# Patient Record
Sex: Female | Born: 1950 | Race: White | Hispanic: No | Smoking: Never smoker
Health system: Southern US, Community
[De-identification: ages and names within clinical notes are randomized; demographics above are authoritative.]

## PROBLEM LIST (undated history)

## (undated) DIAGNOSIS — F419 Anxiety disorder, unspecified: Secondary | ICD-10-CM

## (undated) DIAGNOSIS — M79609 Pain in unspecified limb: Secondary | ICD-10-CM

## (undated) DIAGNOSIS — N6320 Unspecified lump in the left breast, unspecified quadrant: Secondary | ICD-10-CM

## (undated) DIAGNOSIS — R200 Anesthesia of skin: Secondary | ICD-10-CM

## (undated) DIAGNOSIS — N959 Unspecified menopausal and perimenopausal disorder: Secondary | ICD-10-CM

## (undated) DIAGNOSIS — I10 Essential (primary) hypertension: Secondary | ICD-10-CM

## (undated) DIAGNOSIS — J019 Acute sinusitis, unspecified: Secondary | ICD-10-CM

## (undated) DIAGNOSIS — E785 Hyperlipidemia, unspecified: Secondary | ICD-10-CM

## (undated) HISTORY — DX: Pain in unspecified limb: M79.609

## (undated) HISTORY — DX: Anesthesia of skin: R20.0

## (undated) HISTORY — DX: Acute sinusitis, unspecified: J01.90

## (undated) HISTORY — DX: Hyperlipidemia, unspecified: E78.5

## (undated) HISTORY — DX: Essential (primary) hypertension: I10

## (undated) HISTORY — PX: COLONOSCOPY: SHX174

## (undated) HISTORY — DX: Unspecified menopausal and perimenopausal disorder: N95.9

---

## 1998-12-02 ENCOUNTER — Other Ambulatory Visit: Admission: RE | Admit: 1998-12-02 | Discharge: 1998-12-02 | Payer: Self-pay | Admitting: Gynecology

## 1999-08-14 ENCOUNTER — Emergency Department (HOSPITAL_COMMUNITY): Admission: EM | Admit: 1999-08-14 | Discharge: 1999-08-14 | Payer: Self-pay | Admitting: Family Medicine

## 1999-08-14 ENCOUNTER — Encounter: Payer: Self-pay | Admitting: Family Medicine

## 2000-01-04 ENCOUNTER — Other Ambulatory Visit: Admission: RE | Admit: 2000-01-04 | Discharge: 2000-01-04 | Payer: Self-pay | Admitting: Gynecology

## 2001-02-02 ENCOUNTER — Other Ambulatory Visit: Admission: RE | Admit: 2001-02-02 | Discharge: 2001-02-02 | Payer: Self-pay | Admitting: Gynecology

## 2002-02-05 ENCOUNTER — Other Ambulatory Visit: Admission: RE | Admit: 2002-02-05 | Discharge: 2002-02-05 | Payer: Self-pay | Admitting: Gynecology

## 2003-02-07 ENCOUNTER — Other Ambulatory Visit: Admission: RE | Admit: 2003-02-07 | Discharge: 2003-02-07 | Payer: Self-pay | Admitting: Gynecology

## 2003-10-29 ENCOUNTER — Encounter: Admission: RE | Admit: 2003-10-29 | Discharge: 2003-10-29 | Payer: Self-pay | Admitting: Family Medicine

## 2004-03-10 ENCOUNTER — Other Ambulatory Visit: Admission: RE | Admit: 2004-03-10 | Discharge: 2004-03-10 | Payer: Self-pay | Admitting: Gynecology

## 2004-04-10 ENCOUNTER — Encounter: Admission: RE | Admit: 2004-04-10 | Discharge: 2004-04-10 | Payer: Self-pay | Admitting: Family Medicine

## 2005-03-11 ENCOUNTER — Other Ambulatory Visit: Admission: RE | Admit: 2005-03-11 | Discharge: 2005-03-11 | Payer: Self-pay | Admitting: Gynecology

## 2005-05-12 ENCOUNTER — Encounter (INDEPENDENT_AMBULATORY_CARE_PROVIDER_SITE_OTHER): Payer: Self-pay | Admitting: Specialist

## 2005-05-12 ENCOUNTER — Ambulatory Visit (HOSPITAL_BASED_OUTPATIENT_CLINIC_OR_DEPARTMENT_OTHER): Admission: RE | Admit: 2005-05-12 | Discharge: 2005-05-12 | Payer: Self-pay | Admitting: Plastic Surgery

## 2007-06-28 ENCOUNTER — Ambulatory Visit: Payer: Self-pay | Admitting: Gastroenterology

## 2008-01-14 ENCOUNTER — Encounter: Admission: RE | Admit: 2008-01-14 | Discharge: 2008-01-14 | Payer: Self-pay | Admitting: Family Medicine

## 2008-07-31 ENCOUNTER — Ambulatory Visit: Payer: Self-pay | Admitting: Gastroenterology

## 2008-08-14 ENCOUNTER — Ambulatory Visit: Payer: Self-pay | Admitting: Gastroenterology

## 2009-04-24 ENCOUNTER — Ambulatory Visit: Payer: Self-pay | Admitting: Internal Medicine

## 2009-04-24 DIAGNOSIS — E785 Hyperlipidemia, unspecified: Secondary | ICD-10-CM | POA: Insufficient documentation

## 2009-04-24 DIAGNOSIS — N959 Unspecified menopausal and perimenopausal disorder: Secondary | ICD-10-CM | POA: Insufficient documentation

## 2009-04-24 DIAGNOSIS — M79609 Pain in unspecified limb: Secondary | ICD-10-CM | POA: Insufficient documentation

## 2009-04-25 ENCOUNTER — Encounter: Payer: Self-pay | Admitting: Internal Medicine

## 2009-05-01 ENCOUNTER — Ambulatory Visit: Payer: Self-pay | Admitting: Internal Medicine

## 2009-05-03 LAB — CONVERTED CEMR LAB: Vit D, 25-Hydroxy: 51 ng/mL (ref 30–89)

## 2009-05-07 ENCOUNTER — Encounter (INDEPENDENT_AMBULATORY_CARE_PROVIDER_SITE_OTHER): Payer: Self-pay | Admitting: *Deleted

## 2009-05-07 ENCOUNTER — Telehealth (INDEPENDENT_AMBULATORY_CARE_PROVIDER_SITE_OTHER): Payer: Self-pay | Admitting: *Deleted

## 2009-05-11 LAB — CONVERTED CEMR LAB
ALT: 20 units/L (ref 0–35)
AST: 23 units/L (ref 0–37)
Albumin: 3.9 g/dL (ref 3.5–5.2)
Alkaline Phosphatase: 38 units/L — ABNORMAL LOW (ref 39–117)
BUN: 10 mg/dL (ref 6–23)
Basophils Absolute: 0.1 10*3/uL (ref 0.0–0.1)
Basophils Relative: 0.6 % (ref 0.0–3.0)
Bilirubin, Direct: 0.2 mg/dL (ref 0.0–0.3)
CO2: 30 meq/L (ref 19–32)
Calcium: 9.2 mg/dL (ref 8.4–10.5)
Chloride: 103 meq/L (ref 96–112)
Creatinine, Ser: 0.7 mg/dL (ref 0.4–1.2)
Eosinophils Absolute: 0.2 10*3/uL (ref 0.0–0.7)
Eosinophils Relative: 2.3 % (ref 0.0–5.0)
GFR calc non Af Amer: 91.36 mL/min (ref >60)
Glucose, Bld: 79 mg/dL (ref 70–99)
HCT: 39.8 % (ref 36.0–46.0)
Hemoglobin: 13.5 g/dL (ref 12.0–15.0)
Lymphocytes Relative: 35 % (ref 12.0–46.0)
Lymphs Abs: 3.1 10*3/uL (ref 0.7–4.0)
MCHC: 33.8 g/dL (ref 30.0–36.0)
MCV: 92.8 fL (ref 78.0–100.0)
Monocytes Absolute: 0.6 10*3/uL (ref 0.1–1.0)
Monocytes Relative: 6.9 % (ref 3.0–12.0)
Neutro Abs: 4.8 10*3/uL (ref 1.4–7.7)
Neutrophils Relative %: 55.2 % (ref 43.0–77.0)
Platelets: 241 10*3/uL (ref 150.0–400.0)
Potassium: 4.2 meq/L (ref 3.5–5.1)
RBC: 4.29 M/uL (ref 3.87–5.11)
RDW: 12.4 % (ref 11.5–14.6)
Sodium: 138 meq/L (ref 135–145)
TSH: 2.15 microintl units/mL (ref 0.35–5.50)
Total Bilirubin: 0.9 mg/dL (ref 0.3–1.2)
Total Protein: 6.9 g/dL (ref 6.0–8.3)
Uric Acid, Serum: 4.1 mg/dL (ref 2.4–7.0)
WBC: 8.8 10*3/uL (ref 4.5–10.5)

## 2009-09-30 ENCOUNTER — Ambulatory Visit: Payer: Self-pay | Admitting: Family

## 2009-09-30 DIAGNOSIS — J019 Acute sinusitis, unspecified: Secondary | ICD-10-CM | POA: Insufficient documentation

## 2009-10-13 ENCOUNTER — Telehealth: Payer: Self-pay | Admitting: Internal Medicine

## 2010-02-06 ENCOUNTER — Emergency Department (HOSPITAL_COMMUNITY): Admission: EM | Admit: 2010-02-06 | Discharge: 2010-02-06 | Payer: Self-pay | Admitting: Emergency Medicine

## 2011-03-12 NOTE — Op Note (Signed)
Heather Summers, Heather Summers              ACCOUNT NO.:  [id number]   MEDICAL RECORD NO.:  [id number]          PATIENT TYPE:  AMB   LOCATION:  DSC                          FACILITY:  [facility]   PHYSICIAN:  Alfredia Ferguson, M.D.  DATE OF BIRTH:  1951-02-02   DATE OF PROCEDURE:  05/12/2005  DATE OF DISCHARGE:                                 OPERATIVE REPORT   PREOPERATIVE DIAGNOSIS:  Biopsy proven 6 mm dysplastic nevus right posterior  shoulder.   POSTOPERATIVE DIAGNOSIS:  Biopsy proven 6 mm dysplastic nevus right  posterior shoulder.   OPERATION PERFORMED:  Re-excision of biopsy site, dysplastic nevus right  posterior shoulder, with 2 mm margins.   SURGEON:  Alfredia Ferguson, M.D.   ANESTHESIA:  2% Xylocaine with 1:100,000 epinephrine.   INDICATIONS FOR PROCEDURE:  60 year old woman with a biopsy proven  dysplastic nevus right posterior shoulder.  The margins were positive.  She  wishes that I carry out wide excision of the biopsy site to clear the  margins.  She understands the risks of unsightly scarring at the location of  the excision.  In spite of these and other risks discussed with the patient,  the patient wishes to proceed with the surgery.   DESCRIPTION OF PROCEDURE:  Skin marks were placed around the biopsy site  with 2 mm margins.  Local anesthesia was infiltrated with 2% Xylocaine with  1:100,000 epinephrine.  The area was prepped with Betadine and draped with  sterile drapes.  An elliptical excision of the lesion was carried out with 2  mm margins.  The specimen was submitted for pathology.  Wound edges were  undermined for a distance of several mm in all directions.  The wound was  closed by reapproximating the dermis with interrupted 4-0 Monocryl sutures.  The skin edges were united with a running 4-0 Monocryl subcuticular.  The  area was cleansed and dried and Steri-Strips were applied.  The patient  tolerated the procedure well with minimal blood loss.  A light dressing  was  applied and the patient was discharged home in satisfactory condition.      Alfredia Ferguson, M.D.  Electronically Signed     WBB/MEDQ  D:  05/12/2005  T:  05/12/2005  Job:  [id number]   cc:   Venancio Poisson, M.D.

## 2011-07-19 ENCOUNTER — Other Ambulatory Visit: Payer: Self-pay | Admitting: Gynecology

## 2011-08-26 ENCOUNTER — Other Ambulatory Visit: Payer: Self-pay | Admitting: Dermatology

## 2012-02-25 ENCOUNTER — Other Ambulatory Visit: Payer: Self-pay | Admitting: Dermatology

## 2012-03-29 ENCOUNTER — Other Ambulatory Visit: Payer: Self-pay | Admitting: Dermatology

## 2012-10-30 ENCOUNTER — Other Ambulatory Visit: Payer: Self-pay | Admitting: Gynecology

## 2013-09-01 ENCOUNTER — Encounter: Payer: Self-pay | Admitting: *Deleted

## 2013-09-01 ENCOUNTER — Encounter: Payer: Self-pay | Admitting: Interventional Cardiology

## 2013-09-04 ENCOUNTER — Ambulatory Visit (INDEPENDENT_AMBULATORY_CARE_PROVIDER_SITE_OTHER): Payer: BC Managed Care – PPO | Admitting: Interventional Cardiology

## 2013-09-04 ENCOUNTER — Encounter: Payer: Self-pay | Admitting: Interventional Cardiology

## 2013-09-04 VITALS — BP 110/58 | HR 56 | Ht 66.0 in | Wt 139.0 lb

## 2013-09-04 DIAGNOSIS — R0789 Other chest pain: Secondary | ICD-10-CM

## 2013-09-04 DIAGNOSIS — R079 Chest pain, unspecified: Secondary | ICD-10-CM

## 2013-09-04 DIAGNOSIS — E785 Hyperlipidemia, unspecified: Secondary | ICD-10-CM

## 2013-09-04 NOTE — Progress Notes (Signed)
Patient ID: Heather Summers, female   DOB: 1951/04/25, 62 y.o.   MRN: [id number]   Date: 09/04/2013 ID: Heather Summers, DOB September 03, 1951, MRN [id number] PCP: Marga Melnick, MD  Reason: Concerned about possible CAD  ASSESSMENT;  1. Hyperlipidemia 2. Chest pain, atypical 3. Left anterior hemiblock  PLAN:  1. NMR lipid profile. Right now I am not in favor of starting therapy for relatively mild hyperlipidemia although particle number could change my opinion 2. No specific workup for chest pain is indicated 3. Coated aspirin 81 mg daily   SUBJECTIVE: Heather Summers is a 62 y.o. female who is here for evaluation and to be proactive with reference to CAD risk. There is a family history of stroke do to cerebral aneurysm. There is also a family history of diabetes and her father have heart disease. She has no known heart disease. She has rare episodes of fleeting chest discomfort that can last seconds. It is nonexertional. Only known risk factor is that of hyperlipidemia with most recent LDL cholesterol of 128 and 2013. She is an active lady who watches her diet and exercises several times per week. There are no limitations. She denies exertional chest discomfort. She denies symptoms compatible with claudication. She has never had transient neurological complaints. Overall she feels that her life is full and has no limitations.   No Known Allergies  Current Outpatient Prescriptions on File Prior to Visit  Medication Sig Dispense Refill  . CALCIUM PO Take 1 tablet by mouth daily.      . Cholecalciferol (VITAMIN D PO) Take 1 tablet by mouth daily.      . Multiple Vitamin (MULTIVITAMIN) capsule Take 1 capsule by mouth daily.      . Multiple Vitamins-Minerals (ZINC PO) Take 1 tablet by mouth daily.      . Probiotic Product (PROBIOTIC DAILY PO) Take 1 tablet by mouth daily.       No current facility-administered medications on file prior to visit.    Past Medical History  Diagnosis Date    . HYPERLIPIDEMIA   . SINUSITIS, ACUTE   . PERIMENOPAUSAL SYNDROME   . FOOT PAIN, LEFT     No past surgical history on file.  History   Social History  . Marital Status: Divorced    Spouse Name: N/A    Number of Children: N/A  . Years of Education: N/A   Occupational History  . Not on file.   Social History Main Topics  . Smoking status: Never Smoker   . Smokeless tobacco: Not on file  . Alcohol Use: No  . Drug Use: No  . Sexual Activity: Not on file   Other Topics Concern  . Not on file   Social History Narrative  . No narrative on file    Family History  Problem Relation Age of Onset  . Lung cancer Brother   . Heart disease      uncles 30's and 27's  . Stroke    . Hypertension      ROS: Denies neurological symptoms, leg pain with exertion, edema, fainting, weight loss, change in exertional tolerance, orthopnea, and PND. No exertion-related chest discomfort.. Other systems negative for complaints.  OBJECTIVE: BP 110/58  Pulse 56  Ht 5\' 6"  (1.676 m)  Wt 139 lb (63.05 kg)  BMI 22.45 kg/m2,  General: No acute distress, thin, healthy appearing HEENT: normal without jaundice or pallor Neck: JVD absent. Carotids absent Chest: Clear to auscultation and percussion Cardiac: Murmur: 1-2 of  6 systolic murmur left lower sternal border that does not change with maneuvers.. Gallop: Absent. Rhythm: Regular. Other: Normal Abdomen: Bruit: Absent. Pulsation: Absent Extremities: Edema: No edema. Pulses: 2+ Neuro: Normal Psych: Anxious  ECG: Left axis deviation, poor R-wave progression, left anterior hemiblock

## 2013-09-04 NOTE — Patient Instructions (Signed)
Your physician recommends that you continue on your current medications as directed. Please refer to the Current Medication list given to you today.  Your physician recommends that you schedule a follow-up appointment in: As Needed  Your physician recommends that you return for a FASTING lipid profile NMR: on 09/07/13 the lab is open from 7:30am-5:30pm

## 2013-09-07 ENCOUNTER — Other Ambulatory Visit: Payer: BC Managed Care – PPO

## 2013-09-07 DIAGNOSIS — E785 Hyperlipidemia, unspecified: Secondary | ICD-10-CM

## 2013-09-10 LAB — NMR LIPOPROFILE WITH LIPIDS
HDL Size: 9.2 nm (ref >=9.2)
LDL Particle Number: 1510 nmol/L — ABNORMAL HIGH (ref <1000)
LDL Size: 20.9 nm (ref >20.5)
Large HDL-P: 5.9 umol/L (ref >=4.8)
Large VLDL-P: 0.9 nmol/L (ref <=2.7)
Small LDL Particle Number: 303 nmol/L (ref <=527)
Triglycerides: 52 mg/dL (ref <150)
VLDL Size: 36 nm (ref <=46.6)

## 2013-09-13 ENCOUNTER — Telehealth: Payer: Self-pay | Admitting: Interventional Cardiology

## 2013-09-13 NOTE — Telephone Encounter (Signed)
New message      Want bld work results

## 2013-09-13 NOTE — Telephone Encounter (Signed)
DISCUSSED  LAB  RESULTS  WITH  PT.  REVIEWED LAB RESULTS  PER  DR Katrinka Blazing  PT  NEEDS   STATIN THERAPY  FOR MILDLY ABNORMAL  LIPIDS.   AFTER  MUCH DISCUSSION  PT  IS  HESITANT STARTING   MED   WILL FORWARD  TO  DR Katrinka Blazing  FOR  REVIEW .Heather Summers

## 2013-09-14 NOTE — Telephone Encounter (Signed)
pt given result of labs.Dr. Katrinka Blazing does not  believe we should start statin therapy in the absence of other risk factors.pt verbalized understanding.

## 2013-12-03 ENCOUNTER — Other Ambulatory Visit: Payer: Self-pay | Admitting: Gynecology

## 2014-08-27 ENCOUNTER — Encounter: Payer: Self-pay | Admitting: Nurse Practitioner

## 2014-08-27 ENCOUNTER — Ambulatory Visit (INDEPENDENT_AMBULATORY_CARE_PROVIDER_SITE_OTHER): Payer: BC Managed Care – PPO | Admitting: Nurse Practitioner

## 2014-08-27 ENCOUNTER — Telehealth: Payer: Self-pay | Admitting: Interventional Cardiology

## 2014-08-27 ENCOUNTER — Telehealth: Payer: Self-pay | Admitting: Physician Assistant

## 2014-08-27 VITALS — BP 148/88 | HR 60 | Ht 66.0 in | Wt 138.2 lb

## 2014-08-27 DIAGNOSIS — R0789 Other chest pain: Secondary | ICD-10-CM

## 2014-08-27 DIAGNOSIS — R9431 Abnormal electrocardiogram [ECG] [EKG]: Secondary | ICD-10-CM

## 2014-08-27 LAB — TROPONIN I: Troponin I: <0.01 ng/mL (ref <0.06)

## 2014-08-27 NOTE — Telephone Encounter (Signed)
New message          C/o upper chest tightness / pt thinks its indigestion / pt says it got better once she took an aspirin

## 2014-08-27 NOTE — Telephone Encounter (Signed)
Spoke with patient and she had chest pain radiating to her jaw this morning around 9 am, lasted about 5 minutes or less No history of heart disease  Denies shortness of breath, nausea, or diaphoresis Patient stated pain similar to that when she saw Dr Tamala Julian around a year ago Offered an appointment to see Tera Helper NP today at 3:00 or tomorrow at 10:00 Stated she would call back, needed to discuss with supervisor

## 2014-08-27 NOTE — Patient Instructions (Signed)
We will be checking the following labs today BMET and Troponin  We will arrange for a stress Myoview  Monitor your blood pressure at home - try to get an [employer] unit to monitor  Call the [place] office at [phone] if you have any questions, problems or concerns.

## 2014-08-27 NOTE — Telephone Encounter (Signed)
     I spoke with Ms Pollman and notified her that her troponin returned negative. He was appreciative of the call.  Angelena Form PA-C  [facility]

## 2014-08-27 NOTE — Telephone Encounter (Signed)
Left message for patient to call back and ask for triage 

## 2014-08-27 NOTE — Telephone Encounter (Signed)
Patient scheduled to Tera Helper NP today

## 2014-08-27 NOTE — Progress Notes (Signed)
Vertell Limber Holian Date of Birth: July 31, 1951 Medical Record #[id number]  History of Present Illness: Ms. Durflinger is seen back today for a work in visit. Seen for Dr. Tamala Julian. She has no known CAD but has HLD. Seen here a year ago with atypical chest pain.   Called earlier today to report a recurrent spell of chest pain - thus added to my schedule for today.  Comes in here. Here alone. Notes that she may have had some fleeting pain over the past year - nothing that really concerned her in the past. Today, while she was sitting at her desk - she had a dull/pressure like sensation in the upper chest and into her jaw. No associated symptoms. Lasted 5 minutes. Resolved without intervention. She did take an aspirin. No recurrence and painfree at this time. This happened about 9 am today. She exercises fairly regularly - no exertional symptoms noted. Typically does not have issues with her blood pressure.   Current Outpatient Prescriptions  Medication Sig Dispense Refill  . ALPHA LIPOIC ACID PO Take 600 mg by mouth daily.    . Biotin 1000 MCG tablet Take 1,000 mcg by mouth daily.    Marland Kitchen CALCIUM PO Take 1 tablet by mouth daily.    . Cholecalciferol (VITAMIN D PO) Take 1 tablet by mouth daily.    . COLLAGEN-VITAMIN C PO Take by mouth daily.    . Cyanocobalamin (VITAMIN B 12 PO) Take 1,000 mg by mouth daily.    Marland Kitchen MAGNESIUM PO Take 200-400 mg by mouth daily.    . Multiple Vitamin (MULTIVITAMIN) capsule Take 1 capsule by mouth daily.    . Multiple Vitamins-Minerals (ZINC PO) Take 1 tablet by mouth daily.    . Omega-3 Fatty Acids (SEA-OMEGA 70 PO) Take by mouth.    . Probiotic Product (PROBIOTIC DAILY PO) Take 1 tablet by mouth daily.     No current facility-administered medications for this visit.    No Known Allergies  Past Medical History  Diagnosis Date  . HYPERLIPIDEMIA   . SINUSITIS, ACUTE   . PERIMENOPAUSAL SYNDROME   . FOOT PAIN, LEFT     History reviewed. No pertinent past surgical  history.  History  Smoking status  . Never Smoker   Smokeless tobacco  . Not on file    History  Alcohol Use No    Family History  Problem Relation Age of Onset  . Lung cancer Brother   . Heart disease      uncles 58's and 58's  . Stroke    . Hypertension      Review of Systems: The review of systems is per the HPI.  All other systems were reviewed and are negative.  Physical Exam: BP 148/88 mmHg  Pulse 60  Ht 5\' 6"  (1.676 m)  Wt 138 lb 4 oz (62.71 kg)  BMI 22.32 kg/m2  BP 150/90 Patient is very pleasant and in no acute distress. Little anxious. Skin is warm and dry. Color is normal.  HEENT is unremarkable. Normocephalic/atraumatic. PERRL. Sclera are nonicteric. Neck is supple. No masses. No JVD. Lungs are clear. Cardiac exam shows a regular rate and rhythm. Abdomen is soft. Extremities are without edema. Gait and ROM are intact. No gross neurologic deficits noted.  Wt Readings from Last 3 Encounters:  08/27/14 138 lb 4 oz (62.71 kg)  09/04/13 139 lb (63.05 kg)  09/30/09 138 lb 3.2 oz (62.687 kg)    LABORATORY DATA/PROCEDURES: EKG today shows sinus. She does have T  wave inversion in V2 - this would be new. Reviewed with Dr. Radford Pax (DOD) -  Felt to probably be incomplete RBBB with early repolaratization but is new from past tracing.   Lab Results  Component Value Date   WBC 8.8 05/01/2009   HGB 13.5 05/01/2009   HCT 39.8 05/01/2009   PLT 241.0 05/01/2009   GLUCOSE 79 05/01/2009   TRIG 52 09/07/2013   LDLCALC 122* 09/07/2013   ALT 20 05/01/2009   AST 23 05/01/2009   NA 138 05/01/2009   K 4.2 05/01/2009   CL 103 05/01/2009   CREATININE 0.7 05/01/2009   BUN 10 05/01/2009   CO2 30 05/01/2009   TSH 2.15 05/01/2009    BNP (last 3 results) No results for input(s): PROBNP in the last 8760 hours.    LDL Particle Number <1000 nmol/L 1510 (H)   Comments:  Reference Range: ---------------- Low:        <1000 Moderate:       1000-1299 Borderline-High:  3785-8850 High:        1600-2000 Very High:     >2000     LDL (calc) <100 mg/dL 122 (H)   Comments: LDL-C is inaccurate if patient is nonfasting.  Reference Range: ---------------- Optimal:      <100 Near/Above Optimal: 100-129 Borderline High:  130-159 High:        160-189 Very High:     >=190     HDL-C >=40 mg/dL 52   Triglycerides <150 mg/dL 52   Cholesterol, Total <200 mg/dL 184   HDL Particle Number >=30.5 umol/L 26.4 (L)   Large HDL-P >=4.8 umol/L 5.9   Large VLDL-P <=2.7 nmol/L 0.9   Small LDL Particle Number <=527 nmol/L 303   LDL Size >20.5 nm 20.9   HDL Size >=9.2 nm 9.2   VLDL Size <=46.6 nm 36.0   LP-IR Score <=45  25   Comments:  HDL Particle Number, Large HDL-P, Large VLDL-P, Small LDL Particle Number, LDL Size, HDL Size, VLDL Size, and LP-IR Score have been validated and are reported by [employer] but not cleared by the [place] FDA; the clinical utility of these test results has not been fully established.    Resulting Agency [facility]     Narrative     Performed at: [place]        [place], [place] [address]    Specimen Collected: 09/07/13 8:29 AM     Assessment / Plan: 1. Atypical chest pain - abnormal EKG - positive FH for CAD with maternal uncles and she has HLD. Discussed with Dr. Radford Pax. Will checking cardiac enzymes and arranging stress Myoview, continue her aspirin.   2. HLD - not really wanting to take statin therapy.   3. Elevated BP - have asked her to monitor her BP at home -  May need BP lowering agent.   Continue aspirin. Arrange for stress Myoview. See back as needed.   Patient is agreeable to this plan and will call if any problems develop in the interim.   Burtis Junes, RN, [place] [address] [place], [place]   [phone]

## 2014-08-28 LAB — BASIC METABOLIC PANEL
BUN: 16 mg/dL (ref 6–23)
CO2: 29 mEq/L (ref 19–32)
Calcium: 9.4 mg/dL (ref 8.4–10.5)
Chloride: 105 mEq/L (ref 96–112)
Creatinine, Ser: 1.1 mg/dL (ref 0.4–1.2)
GFR: 51.64 mL/min — ABNORMAL LOW (ref >60.00)
Glucose, Bld: 90 mg/dL (ref 70–99)
Potassium: 4.3 mEq/L (ref 3.5–5.1)
Sodium: 140 mEq/L (ref 135–145)

## 2014-09-10 ENCOUNTER — Telehealth: Payer: Self-pay

## 2014-09-10 NOTE — Telephone Encounter (Signed)
Pt was seen by Joan Mayans on 11/3 for chest pian. Cecille Rubin recommended that pt have a nuclear stress test. The appt was made for 11/19. Pt has cancelled her myoview she is concerned about the use of nuclear medication and radiation used during the test. Pt sts that the episode she had, she now believes was related to stress.pt would like to start with a plain gxt , if that is abnormal she will consider proceeding with a myoview.pt adv I will fwd a message to [place] and Cecille Rubin G,NP and call back with their recommendation. She verbalized understanding.

## 2014-09-10 NOTE — Telephone Encounter (Signed)
Her resting EKG is abnormal - this is why Myoview was ordered.

## 2014-09-11 NOTE — Telephone Encounter (Signed)
F/u ° ° ° °Pt returning a call from nurse. °

## 2014-09-11 NOTE — Telephone Encounter (Signed)
Pt given [employer].

## 2014-09-11 NOTE — Telephone Encounter (Signed)
Pt has decided to reschedule stress myoview.  Will forward to [facility] to contact patient to reschedule.

## 2014-09-12 ENCOUNTER — Encounter (HOSPITAL_COMMUNITY): Payer: BC Managed Care – PPO

## 2014-09-17 ENCOUNTER — Ambulatory Visit (HOSPITAL_COMMUNITY): Payer: BC Managed Care – PPO | Attending: Internal Medicine | Admitting: Radiology

## 2014-09-17 DIAGNOSIS — R0789 Other chest pain: Secondary | ICD-10-CM | POA: Diagnosis not present

## 2014-09-17 DIAGNOSIS — E785 Hyperlipidemia, unspecified: Secondary | ICD-10-CM | POA: Insufficient documentation

## 2014-09-17 DIAGNOSIS — R9431 Abnormal electrocardiogram [ECG] [EKG]: Secondary | ICD-10-CM

## 2014-09-17 DIAGNOSIS — R42 Dizziness and giddiness: Secondary | ICD-10-CM | POA: Insufficient documentation

## 2014-09-17 MED ORDER — TECHNETIUM TC 99M SESTAMIBI GENERIC - CARDIOLITE
11.0000 | Freq: Once | INTRAVENOUS | Status: AC | PRN
  Administered 2014-09-17: 11 via INTRAVENOUS

## 2014-09-17 MED ORDER — TECHNETIUM TC 99M SESTAMIBI GENERIC - CARDIOLITE
33.0000 | Freq: Once | INTRAVENOUS | Status: AC | PRN
  Administered 2014-09-17: 33 via INTRAVENOUS

## 2014-09-17 NOTE — Progress Notes (Signed)
[facility] [address] [place], [place] [address] [phone]    Cardiology Nuclear Med Study  Heather Summers is a 63 y.o. female     MRN : [id number]     DOB: 11-09-1950  Procedure Date: 09/17/2014  Nuclear Med Background Indication for Stress Test:  Evaluation for Ischemia and Abnormal EKG History:  No known CAD Cardiac Risk Factors: Lipids  Symptoms:  Chest Pressure.  (last date of chest discomfort was three weeks ago) and Dizziness   Nuclear Pre-Procedure Caffeine/Decaff Intake:  7:00pm NPO After: 10:00pm   Lungs:  clear O2 Sat: 98% on room air. IV 0.9% NS with Angio Cath:  22g  IV Site: R Hand  IV Started by:  Matilde Haymaker, RN  Chest Size (in):  36 Cup Size: A  Height: 5\' 6"  (1.676 m)  Weight:  134 lb (60.782 kg)  BMI:  Body mass index is 21.64 kg/(m^2). Tech Comments:  n/a    Nuclear Med Study 1 or 2 day study: 1 day  Stress Test Type:  Stress  Reading MD: n/a  Order Authorizing Provider:  Wyvonnia Lora and Kathrene Alu  Resting Radionuclide: Technetium 32m Sestamibi  Resting Radionuclide Dose: 11.0 mCi   Stress Radionuclide:  Technetium 55m Sestamibi  Stress Radionuclide Dose: 33.0 mCi           Stress Protocol Rest HR: 65 Stress HR: 137  Rest BP: 161/98 Stress BP: 209/107  Exercise Time (min): 6:00 METS: 7.0   Predicted Max HR: 157 bpm % Max HR: 87.26 bpm Rate Pressure Product: 28633   Dose of Adenosine (mg):  n/a Dose of Lexiscan: n/a mg  Dose of Atropine (mg): n/a Dose of Dobutamine: n/a mcg/kg/min (at max HR)  Stress Test Technologist: Glade Lloyd, BS-ES  Nuclear Technologist:  Earl Many, CNMT     Rest Procedure:  Myocardial perfusion imaging was performed at rest 45 minutes following the intravenous administration of Technetium 30m Sestamibi. Rest ECG: NSR - Normal EKG  Stress Procedure:  The patient exercised on the treadmill utilizing the Bruce Protocol for 6:00 minutes. The patient stopped due  to fatigue, increased BP and denied any chest pain.  Technetium 35m Sestamibi was injected at peak exercise and myocardial perfusion imaging was performed after a brief delay. Stress ECG: No significant change from baseline ECG  QPS Raw Data Images:  Normal; no motion artifact; normal heart/lung ratio. Stress Images:  Normal homogeneous uptake in all areas of the myocardium. Rest Images:  Normal homogeneous uptake in all areas of the myocardium. Subtraction (SDS):  No evidence of ischemia. Transient Ischemic Dilatation (Normal <1.22):  0.89 Lung/Heart Ratio (Normal <0.45):  0.28  Quantitative Gated Spect Images QGS EDV:  69 ml QGS ESV:  16 ml  Impression Exercise Capacity:  Fair exercise capacity. BP Response:  Normal blood pressure response. Clinical Symptoms:  No significant symptoms noted. ECG Impression:  No significant ST segment change suggestive of ischemia. Comparison with Prior Nuclear Study: No previous nuclear study performed  Overall Impression:  Low risk stress nuclear study with no ischemia. BP increased to 197/100 during exercise. .  LV Ejection Fraction: 71%.  LV Wall Motion:  NL LV Function; NL Wall Motion  Candee Furbish, MD

## 2014-09-18 ENCOUNTER — Other Ambulatory Visit: Payer: Self-pay | Admitting: *Deleted

## 2014-09-18 ENCOUNTER — Other Ambulatory Visit (INDEPENDENT_AMBULATORY_CARE_PROVIDER_SITE_OTHER): Payer: BC Managed Care – PPO | Admitting: *Deleted

## 2014-09-18 DIAGNOSIS — I10 Essential (primary) hypertension: Secondary | ICD-10-CM

## 2014-09-18 LAB — BASIC METABOLIC PANEL
BUN: 12 mg/dL (ref 6–23)
CO2: 28 mEq/L (ref 19–32)
Calcium: 9.2 mg/dL (ref 8.4–10.5)
Chloride: 103 mEq/L (ref 96–112)
Creatinine, Ser: 0.8 mg/dL (ref 0.4–1.2)
GFR: 79.19 mL/min (ref >60.00)
Glucose, Bld: 104 mg/dL — ABNORMAL HIGH (ref 70–99)
Potassium: 4 mEq/L (ref 3.5–5.1)
Sodium: 138 mEq/L (ref 135–145)

## 2014-09-18 MED ORDER — LISINOPRIL 10 MG PO TABS: 10.0000 mg | ORAL_TABLET | Freq: Every day | ORAL | Status: DC

## 2014-09-23 ENCOUNTER — Telehealth: Payer: Self-pay | Admitting: *Deleted

## 2014-09-23 ENCOUNTER — Telehealth: Payer: Self-pay | Admitting: Nurse Practitioner

## 2014-09-23 NOTE — Telephone Encounter (Signed)
Follow Up  Pt returning call from today

## 2014-09-23 NOTE — Telephone Encounter (Signed)
New msg  Patient returning call about lab results. Please contact at [phone].

## 2014-09-24 ENCOUNTER — Other Ambulatory Visit: Payer: Self-pay | Admitting: *Deleted

## 2014-09-24 MED ORDER — ASPIRIN EC 81 MG PO TBEC: 81.0000 mg | DELAYED_RELEASE_TABLET | Freq: Every day | ORAL | Status: DC

## 2014-09-24 NOTE — Telephone Encounter (Signed)
Follow Up   Pt returned call// Says she had 2 missed calls she is not sure if she has already spoke with the nurse or if the nurse was simply calling back. Please call back to clarify

## 2014-09-24 NOTE — Telephone Encounter (Signed)
S/w pt stated was okay to start baby asa ( 81 mg ) daily

## 2014-09-24 NOTE — Telephone Encounter (Signed)
Left message on machine for pt to contact the office and may start baby aspirin

## 2014-09-27 ENCOUNTER — Telehealth: Payer: Self-pay | Admitting: Interventional Cardiology

## 2014-09-27 NOTE — Telephone Encounter (Signed)
New message     Pt started taking lisinopril 10mg  on thanksgiving day.  Today she is having a period.  She has not had a period in 41yrs.  Could this be the new medication causing this?

## 2014-09-27 NOTE — Telephone Encounter (Signed)
Returned pt call. Pt adv that her bleeding is not related to Lisinopril. Pt reports that the bleeding she was experiencing is not vaginal it was her hemorrhoids. Pt adv that she has recently started a Asa regimen and that could cause her to bleed a little more.pt verbalized understanding.

## 2014-10-11 ENCOUNTER — Other Ambulatory Visit (INDEPENDENT_AMBULATORY_CARE_PROVIDER_SITE_OTHER): Payer: BC Managed Care – PPO | Admitting: *Deleted

## 2014-10-11 DIAGNOSIS — E785 Hyperlipidemia, unspecified: Secondary | ICD-10-CM

## 2014-10-11 LAB — BASIC METABOLIC PANEL
BUN: 14 mg/dL (ref 6–23)
CHLORIDE: 102 meq/L (ref 96–112)
CO2: 28 mEq/L (ref 19–32)
Calcium: 9.3 mg/dL (ref 8.4–10.5)
Creatinine, Ser: 0.8 mg/dL (ref 0.4–1.2)
GFR: 76.9 mL/min (ref >60.00)
Glucose, Bld: 80 mg/dL (ref 70–99)
POTASSIUM: 3.9 meq/L (ref 3.5–5.1)
SODIUM: 137 meq/L (ref 135–145)

## 2014-10-11 NOTE — Addendum Note (Signed)
Addended by: Eulis Foster on: 10/11/2014 09:04 AM   Modules accepted: Orders

## 2014-10-14 ENCOUNTER — Encounter: Payer: Self-pay | Admitting: *Deleted

## 2014-12-06 ENCOUNTER — Other Ambulatory Visit: Payer: Self-pay | Admitting: Obstetrics and Gynecology

## 2014-12-09 LAB — CYTOLOGY - PAP

## 2014-12-11 ENCOUNTER — Telehealth: Payer: Self-pay | Admitting: Interventional Cardiology

## 2014-12-11 NOTE — Telephone Encounter (Signed)
Follow Up      Pt returning phone call from [place].

## 2014-12-11 NOTE — Telephone Encounter (Signed)
Pt c/o medication issue:  1. Name of Medication: Lisinopril  2. How are you currently taking this medication (dosage and times per day)? 1 tab once a day  3. Are you having a reaction (difficulty breathing--STAT)?   4. What is your medication issue? Pt calling stating that she feels like her bp is running high like it use to be before she started taking this medication. Bp today was 147/83. Pt wants to know if she needs to be put on a different medication.

## 2014-12-11 NOTE — Telephone Encounter (Signed)
returned pt call. lmtcb 

## 2014-12-11 NOTE — Telephone Encounter (Signed)
F/u ° ° °Pt returning your call °

## 2014-12-11 NOTE — Telephone Encounter (Signed)
Returned pt call.lmtcb 

## 2014-12-12 NOTE — Telephone Encounter (Signed)
Returned pt call. Pt sts thst her bp yesterday was 147/83. It was slightly elevated her her GYN appt last wk. She is currently taking Lisinopril 10mg  qd. She does not measure her bp regularly. Adv her to measure and record her bp daily 2-3 hrs after taking her med. Call the office on Monday with her bp readings. I will fwd an update to Dr.Smith and callback with his recommendation. She agreed with plan

## 2015-01-09 ENCOUNTER — Telehealth: Payer: Self-pay | Admitting: Interventional Cardiology

## 2015-01-09 NOTE — Telephone Encounter (Signed)
Returned pt call. Pt sts that she stop taking her lisinopril 3 days age on her own because it was causing "kidney pressure" Or pressure in her lower back. She has been drinking plenty of fluids and she has normal urine output. Pt denies any other symptoms. She has not been seen by her pcp or a nephrologist for evaluation. When she was initially prescribed lisinopril a bmet was performed 7-10 days following. She st that "sinec we needed to ck her kidney function lisinopril must be affecting her kidneys" Adv her that her bmet labs were normal, it is routine to ck a pt kidney function after starting a new med/ or increase a med. Adv her that her last bmet's were normal. Adv pt to resume lisinopril as described. She has not been seen by her pcp in a while she will schedule a f/u. FYI fwd to Dr.Smith

## 2015-01-09 NOTE — Telephone Encounter (Signed)
New message     Pt c/o medication issue:  1. Name of Medication: lisinopril  2. How are you currently taking this medication (dosage and times per day)? Pt stopped taking medication 3 days ago 3. Are you having a reaction (difficulty breathing--STAT)? no  4. What is your medication issue? Pt was having "pressure" in her kidney area so she stopped taking her lisinopril.  Now she is asking for advice as to what to do regarding her bp

## 2015-01-18 ENCOUNTER — Other Ambulatory Visit: Payer: Self-pay | Admitting: Nurse Practitioner

## 2015-01-28 ENCOUNTER — Other Ambulatory Visit: Payer: Self-pay | Admitting: Obstetrics and Gynecology

## 2015-04-16 ENCOUNTER — Encounter: Payer: Self-pay | Admitting: Gastroenterology

## 2015-05-18 ENCOUNTER — Other Ambulatory Visit: Payer: Self-pay | Admitting: Interventional Cardiology

## 2015-08-06 ENCOUNTER — Other Ambulatory Visit: Payer: Self-pay | Admitting: Obstetrics and Gynecology

## 2015-08-07 LAB — CYTOLOGY - PAP

## 2015-09-14 ENCOUNTER — Emergency Department (HOSPITAL_COMMUNITY)
Admission: EM | Admit: 2015-09-14 | Discharge: 2015-09-14 | Disposition: A | Discharge status: Home / Self Care | Payer: Commercial Managed Care - HMO | Source: Home / Self Care

## 2015-09-14 ENCOUNTER — Encounter (HOSPITAL_COMMUNITY): Payer: Self-pay | Admitting: Emergency Medicine

## 2015-09-14 DIAGNOSIS — T148 Other injury of unspecified body region: Secondary | ICD-10-CM | POA: Diagnosis not present

## 2015-09-14 DIAGNOSIS — S0181XA Laceration without foreign body of other part of head, initial encounter: Secondary | ICD-10-CM

## 2015-09-14 DIAGNOSIS — T148XXA Other injury of unspecified body region, initial encounter: Secondary | ICD-10-CM

## 2015-09-14 MED ORDER — LIDOCAINE HCL 2 % IJ SOLN
INTRAMUSCULAR | Status: AC
  Filled 2015-09-14: qty 20

## 2015-09-14 NOTE — ED Provider Notes (Signed)
CSN: [id number]     Arrival date & time 09/14/15  1348 History   None    Chief Complaint  Patient presents with  . Fall  . Facial Laceration   (Consider location/radiation/quality/duration/timing/severity/associated sxs/prior Treatment) HPI Comments: [place] and fell on her hands and hit chin on floor.  Denies LOC.  Denies HA.  Patient is a 64 y.o. female presenting with fall. The history is provided by the patient.  Fall This is a new problem. The current episode started 1 to 2 hours ago. The problem occurs rarely. The problem has not changed since onset.The symptoms are aggravated by exertion. Nothing relieves the symptoms. She has tried nothing for the symptoms.    Past Medical History  Diagnosis Date  . HYPERLIPIDEMIA   . SINUSITIS, ACUTE   . PERIMENOPAUSAL SYNDROME   . FOOT PAIN, LEFT    History reviewed. No pertinent past surgical history. Family History  Problem Relation Age of Onset  . Lung cancer Brother   . Heart disease      uncles 65's and 67's  . Stroke    . Hypertension     Social History  Substance Use Topics  . Smoking status: Never Smoker   . Smokeless tobacco: None  . Alcohol Use: No   OB History    No data available     Review of Systems  Constitutional: Negative.   HENT: Negative.   Eyes: Negative.   Respiratory: Negative.   Cardiovascular: Negative.   Gastrointestinal: Negative.   Endocrine: Negative.   Genitourinary: Negative.   Musculoskeletal: Negative.   Skin: Positive for wound.       Laceration to chin and abrasion to right dorsum of hand and left palm of hand.  Allergic/Immunologic: Negative.   Neurological: Negative.   Hematological: Negative.   Psychiatric/Behavioral: Negative.     Allergies  Review of patient's allergies indicates no known allergies.  Home Medications   Prior to Admission medications   Medication Sig Start Date End Date Taking? Authorizing Provider  ALPHA LIPOIC ACID PO  Take 600 mg by mouth daily.    Historical Provider, MD  aspirin EC 81 MG tablet Take 1 tablet (81 mg total) by mouth daily. 09/24/14   Burtis Junes, NP  Biotin 1000 MCG tablet Take 1,000 mcg by mouth daily.    Historical Provider, MD  CALCIUM PO Take 1 tablet by mouth daily.    Historical Provider, MD  Cholecalciferol (VITAMIN D PO) Take 1 tablet by mouth daily.    Historical Provider, MD  COLLAGEN-VITAMIN C PO Take by mouth daily.    Historical Provider, MD  Cyanocobalamin (VITAMIN B 12 PO) Take 1,000 mg by mouth daily.    Historical Provider, MD  lisinopril (PRINIVIL,ZESTRIL) 10 MG tablet TAKE 1 TABLET BY MOUTH DAILY. 05/20/15   Belva Crome, MD  MAGNESIUM PO Take 200-400 mg by mouth daily.    Historical Provider, MD  Multiple Vitamin (MULTIVITAMIN) capsule Take 1 capsule by mouth daily.    Historical Provider, MD  Multiple Vitamins-Minerals (ZINC PO) Take 1 tablet by mouth daily.    Historical Provider, MD  Omega-3 Fatty Acids (SEA-OMEGA 70 PO) Take by mouth.    Historical Provider, MD  Probiotic Product (PROBIOTIC DAILY PO) Take 1 tablet by mouth daily.    Historical Provider, MD   Meds Ordered and Administered this Visit  Medications - No data to display  BP 157/80 mmHg  Pulse 64  Temp(Src) 98.9 F (  37.2 C) (Oral)  Resp 18  SpO2 100% No data found.   Physical Exam  Constitutional: She is oriented to person, place, and time. She appears well-developed and well-nourished.  HENT:  Head: Normocephalic and atraumatic.  Right Ear: External ear normal.  Left Ear: External ear normal.  Mouth/Throat: Oropharynx is clear and moist.  Eyes: Conjunctivae and EOM are normal. Pupils are equal, round, and reactive to light.  Neck: Normal range of motion. Neck supple.  Cardiovascular: Normal rate, regular rhythm and normal heart sounds.   Pulmonary/Chest: Effort normal and breath sounds normal.  Neurological: She is alert and oriented to person, place, and time.  Skin:  Chin with 1/2 cm  laceration and abrasion.  Right dorsum of hand with abrasion and left palm with abrasion.    ED Course  .Marland KitchenLaceration Repair Date/Time: 09/14/2015 3:33 PM Performed by: Lysbeth Penner Authorized by: Lysbeth Penner Consent: Verbal consent obtained. Written consent not obtained. Risks and benefits: risks, benefits and alternatives were discussed Consent given by: patient Patient understanding: patient states understanding of the procedure being performed Patient consent: the patient's understanding of the procedure matches consent given Procedure consent: procedure consent matches procedure scheduled Relevant documents: relevant documents present and verified Test results: test results available and properly labeled Site marked: the operative site was marked Imaging studies: imaging studies available Patient identity confirmed: verbally with patient Body area: head/neck Location details: chin Laceration length: 1 cm Foreign bodies: no foreign bodies Tendon involvement: none Nerve involvement: none Vascular damage: no Anesthesia: local infiltration Local anesthetic: lidocaine 2% without epinephrine Patient sedated: no Preparation: Patient was prepped and draped in the usual sterile fashion. Irrigation solution: saline Amount of cleaning: standard Debridement: none Degree of undermining: none Skin closure: 5-0 nylon Number of sutures: 3 Technique: simple Approximation: close Approximation difficulty: simple Dressing: antibiotic ointment Patient tolerance: Patient tolerated the procedure well with no immediate complications   (including critical care time)  Labs Review Labs Reviewed - No data to display  Imaging Review No results found.   Visual Acuity Review  Right Eye Distance:   Left Eye Distance:   Bilateral Distance:    Right Eye Near:   Left Eye Near:    Bilateral Near:         MDM   1. Chin laceration, initial encounter   2. Abrasion    3  #5.0 sutures to chin.  Bacitracin applied to incision and sutures. Instructed to keep dry for 2 days and then follow up for suture removal in 7 days with PCP or this clinic. Explained to apply neosporin ointment to abrasions. Abrasions cleaned with ETOH and then bacitracin ointment and bandaid applied.  Advised to take tylenol and advil from home for pain prn.   Lysbeth Penner, FNP 09/14/15 1533  Lysbeth Penner, [place] 09/14/15 1536

## 2015-09-14 NOTE — Discharge Instructions (Signed)
Laceration Care, Adult  A laceration is a cut that goes through all layers of the skin. The cut also goes into the tissue that is right under the skin. Some cuts heal on their own. Others need to be closed with stitches (sutures), staples, skin adhesive strips, or wound glue. Taking care of your cut lowers your risk of infection and helps your cut to heal better.  HOW TO TAKE CARE OF YOUR CUT  For stitches or staples:  · Keep the wound clean and dry.  · If you were given a bandage (dressing), you should change it at least one time per day or as told by your doctor. You should also change it if it gets wet or dirty.  · Keep the wound completely dry for the first 24 hours or as told by your doctor. After that time, you may take a shower or a bath. However, make sure that the wound is not soaked in water until after the stitches or staples have been removed.  · Clean the wound one time each day or as told by your doctor:    Wash the wound with soap and water.    Rinse the wound with water until all of the soap comes off.    Pat the wound dry with a clean towel. Do not rub the wound.  · After you clean the wound, put a thin layer of antibiotic ointment on it as told by your doctor. This ointment:    Helps to prevent infection.    Keeps the bandage from sticking to the wound.  · Have your stitches or staples removed as told by your doctor.  If your doctor used skin adhesive strips:   · Keep the wound clean and dry.  · If you were given a bandage, you should change it at least one time per day or as told by your doctor. You should also change it if it gets dirty or wet.  · Do not get the skin adhesive strips wet. You can take a shower or a bath, but be careful to keep the wound dry.  · If the wound gets wet, pat it dry with a clean towel. Do not rub the wound.  · Skin adhesive strips fall off on their own. You can trim the strips as the wound heals. Do not remove any strips that are still stuck to the wound. They will  fall off after a while.  If your doctor used wound glue:  · Try to keep your wound dry, but you may briefly wet it in the shower or bath. Do not soak the wound in water, such as by swimming.  · After you take a shower or a bath, gently pat the wound dry with a clean towel. Do not rub the wound.  · Do not do any activities that will make you really sweaty until the skin glue has fallen off on its own.  · Do not apply liquid, cream, or ointment medicine to your wound while the skin glue is still on.  · If you were given a bandage, you should change it at least one time per day or as told by your doctor. You should also change it if it gets dirty or wet.  · If a bandage is placed over the wound, do not let the tape for the bandage touch the skin glue.  · Do not pick at the glue. The skin glue usually stays on for 5-10 days. Then, it   falls off of the skin.  General Instructions   · To help prevent scarring, make sure to cover your wound with sunscreen whenever you are outside after stitches are removed, after adhesive strips are removed, or when wound glue stays in place and the wound is healed. Make sure to wear a sunscreen of at least 30 SPF.  · Take over-the-counter and prescription medicines only as told by your doctor.  · If you were given antibiotic medicine or ointment, take or apply it as told by your doctor. Do not stop using the antibiotic even if your wound is getting better.  · Do not scratch or pick at the wound.  · Keep all follow-up visits as told by your doctor. This is important.  · Check your wound every day for signs of infection. Watch for:    Redness, swelling, or pain.    Fluid, blood, or pus.  · Raise (elevate) the injured area above the level of your heart while you are sitting or lying down, if possible.  GET HELP IF:  · You got a tetanus shot and you have any of these problems at the injection site:    Swelling.    Very bad pain.    Redness.    Bleeding.  · You have a fever.  · A wound that was  closed breaks open.  · You notice a bad smell coming from your wound or your bandage.  · You notice something coming out of the wound, such as wood or glass.  · Medicine does not help your pain.  · You have more redness, swelling, or pain at the site of your wound.  · You have fluid, blood, or pus coming from your wound.  · You notice a change in the color of your skin near your wound.  · You need to change the bandage often because fluid, blood, or pus is coming from the wound.  · You start to have a new rash.  · You start to have numbness around the wound.  GET HELP RIGHT AWAY IF:  · You have very bad swelling around the wound.  · Your pain suddenly gets worse and is very bad.  · You notice painful lumps near the wound or on skin that is anywhere on your body.  · You have a red streak going away from your wound.  · The wound is on your hand or foot and you cannot move a finger or toe like you usually can.  · The wound is on your hand or foot and you notice that your fingers or toes look pale or bluish.     This information is not intended to replace advice given to you by your health care provider. Make sure you discuss any questions you have with your health care provider.     Document Released: 03/29/2008 Document Revised: 02/25/2015 Document Reviewed: 10/07/2014  Elsevier Interactive Patient Education ©2016 [employer]

## 2015-09-14 NOTE — ED Notes (Addendum)
The patient presented to the [facility] with a complaint of a laceration to her chin secondary to a fall that occurred today. The patient denied any LOC. The patient stated that her TDAP was up to date.

## 2015-09-21 ENCOUNTER — Emergency Department (INDEPENDENT_AMBULATORY_CARE_PROVIDER_SITE_OTHER)
Admission: EM | Admit: 2015-09-21 | Discharge: 2015-09-21 | Disposition: A | Discharge status: Home / Self Care | Payer: Commercial Managed Care - HMO | Source: Home / Self Care | Attending: Family Medicine | Admitting: Family Medicine

## 2015-09-21 ENCOUNTER — Encounter (HOSPITAL_COMMUNITY): Payer: Self-pay | Admitting: *Deleted

## 2015-09-21 DIAGNOSIS — Z4802 Encounter for removal of sutures: Secondary | ICD-10-CM

## 2015-09-21 DIAGNOSIS — S0181XD Laceration without foreign body of other part of head, subsequent encounter: Secondary | ICD-10-CM | POA: Diagnosis not present

## 2015-09-21 NOTE — ED Provider Notes (Signed)
CSN: [id number]     Arrival date & time 09/21/15  1331 History   None    Chief Complaint  Patient presents with  . Suture / Staple Removal   (Consider location/radiation/quality/duration/timing/severity/associated sxs/prior Treatment) Patient is a 64 y.o. female presenting with suture removal. The history is provided by the patient. No language interpreter was used.  Suture / Staple Removal This is a new problem. Episode onset: Got suture in 7 days ago on her chin.  Here for removal. Patient feels okay. Some pain when touched. No other concern.  Past Medical History  Diagnosis Date  . HYPERLIPIDEMIA   . SINUSITIS, ACUTE   . PERIMENOPAUSAL SYNDROME   . FOOT PAIN, LEFT    History reviewed. No pertinent past surgical history. Family History  Problem Relation Age of Onset  . Lung cancer Brother   . Heart disease      uncles 49's and 25's  . Stroke    . Hypertension     Social History  Substance Use Topics  . Smoking status: Never Smoker   . Smokeless tobacco: None  . Alcohol Use: No   OB History    No data available     Review of Systems  Respiratory: Negative.   Cardiovascular: Negative.   Skin: Positive for wound.  All other systems reviewed and are negative.   Allergies  Review of patient's allergies indicates no known allergies.  Home Medications   Prior to Admission medications   Medication Sig Start Date End Date Taking? Authorizing Provider  ALPHA LIPOIC ACID PO Take 600 mg by mouth daily.    Historical Provider, MD  aspirin EC 81 MG tablet Take 1 tablet (81 mg total) by mouth daily. 09/24/14   Burtis Junes, NP  Biotin 1000 MCG tablet Take 1,000 mcg by mouth daily.    Historical Provider, MD  CALCIUM PO Take 1 tablet by mouth daily.    Historical Provider, MD  Cholecalciferol (VITAMIN D PO) Take 1 tablet by mouth daily.    Historical Provider, MD  COLLAGEN-VITAMIN C PO Take by mouth daily.    Historical Provider, MD  Cyanocobalamin (VITAMIN B 12 PO)  Take 1,000 mg by mouth daily.    Historical Provider, MD  lisinopril (PRINIVIL,ZESTRIL) 10 MG tablet TAKE 1 TABLET BY MOUTH DAILY. 05/20/15   Belva Crome, MD  MAGNESIUM PO Take 200-400 mg by mouth daily.    Historical Provider, MD  Multiple Vitamin (MULTIVITAMIN) capsule Take 1 capsule by mouth daily.    Historical Provider, MD  Multiple Vitamins-Minerals (ZINC PO) Take 1 tablet by mouth daily.    Historical Provider, MD  Omega-3 Fatty Acids (SEA-OMEGA 70 PO) Take by mouth.    Historical Provider, MD  Probiotic Product (PROBIOTIC DAILY PO) Take 1 tablet by mouth daily.    Historical Provider, MD   Meds Ordered and Administered this Visit  Medications - No data to display  BP 128/70 mmHg  Pulse 60  Temp(Src) 97.8 F (36.6 C) (Oral)  SpO2 100% No data found.   Physical Exam  Constitutional: She appears well-developed. No distress.  Cardiovascular: Normal rate, regular rhythm and normal heart sounds.   No murmur heard. Pulmonary/Chest: Effort normal and breath sounds normal. No respiratory distress.  Skin:     Nursing note and vitals reviewed.   ED Course  Procedures (including critical care time)  Labs Review Labs Reviewed - No data to display  Imaging Review No results found.   Visual Acuity Review  Right Eye Distance:   Left Eye Distance:   Bilateral Distance:    Right Eye Near:   Left Eye Near:    Bilateral Near:         MDM  No diagnosis found. Visit for suture removal  Chin laceration, subsequent encounter  Chin cleaned with betadine prior to removal. Sterile scissors and forceps were used for suture removal. 3 sutures removed in total. Wound cleaned with alcohol swab after removal. Skin care instruction given. F/U as needed.    Kinnie Feil, MD 09/21/15 1535

## 2015-09-21 NOTE — ED Notes (Signed)
Sutures intact to chin; sutures placed 11/20.  No S/S infection.

## 2015-09-21 NOTE — Discharge Instructions (Signed)
It was nice seeing you today. I am glad your chin wound healed very well. I removed all 3 sutures placed from last visit. Keep area clean and dry. See [place] as needed.   Suture Removal, Care After Refer to this sheet in the next few weeks. These instructions provide you with information on caring for yourself after your procedure. Your health care provider may also give you more specific instructions. Your treatment has been planned according to current medical practices, but problems sometimes occur. Call your health care provider if you have any problems or questions after your procedure. WHAT TO EXPECT AFTER THE PROCEDURE After your stitches (sutures) are removed, it is typical to have the following:  Some discomfort and swelling in the wound area.  Slight redness in the area. HOME CARE INSTRUCTIONS   If you have skin adhesive strips over the wound area, do not take the strips off. They will fall off on their own in a few days. If the strips remain in place after 14 days, you may remove them.  Change any bandages (dressings) at least once a day or as directed by your health care provider. If the bandage sticks, soak it off with warm, soapy water.  Apply cream or ointment only as directed by your health care provider. If using cream or ointment, wash the area with soap and water 2 times a day to remove all the cream or ointment. Rinse off the soap and pat the area dry with a clean towel.  Keep the wound area dry and clean. If the bandage becomes wet or dirty, or if it develops a bad smell, change it as soon as possible.  Continue to protect the wound from injury.  Use sunscreen when out in the sun. New scars become sunburned easily. SEEK MEDICAL CARE IF:  You have increasing redness, swelling, or pain in the wound.  You see pus coming from the wound.  You have a fever.  You notice a bad smell coming from the wound or dressing.  Your wound breaks open (edges not staying together).     This information is not intended to replace advice given to you by your health care provider. Make sure you discuss any questions you have with your health care provider.   Document Released: 07/06/2001 Document Revised: 08/01/2013 Document Reviewed: 05/23/2013 Elsevier Interactive Patient Education [employer].

## 2016-01-14 ENCOUNTER — Other Ambulatory Visit: Payer: Self-pay | Admitting: Obstetrics and Gynecology

## 2016-01-15 LAB — CYTOLOGY - PAP

## 2016-07-15 ENCOUNTER — Encounter: Payer: Self-pay | Admitting: Neurology

## 2016-07-15 ENCOUNTER — Ambulatory Visit (INDEPENDENT_AMBULATORY_CARE_PROVIDER_SITE_OTHER): Payer: Commercial Managed Care - HMO | Admitting: Neurology

## 2016-07-15 VITALS — BP 142/84 | HR 60 | Ht 66.0 in | Wt 139.0 lb

## 2016-07-15 DIAGNOSIS — R202 Paresthesia of skin: Secondary | ICD-10-CM

## 2016-07-15 NOTE — Progress Notes (Signed)
PATIENT: Heather Summers DOB: [birth year]  Chief Complaint  Patient presents with  . Numbness    She is here to have her progressively worsening numbness evaluated.  States her symptoms started in her toes a little over one year ago.  Now, she is experiencing the feeling in other areas (calves, hands).     HISTORICAL  Heather Summers is a 65 years old right-handed female, seen in refer by Her primary care physician Dr. [name] for evaluation of numbness in toes on July 15 2016  I reviewed and summarized the referring note, she had a history of hypertension, Hyperlipidemia, strong family history of brain aneurysm, both of her parents died of brain aneurysm, father disease at age 25, mother died at 52,   I also reviewed laboratory evaluation in July 2017 showed normal CBC, hemoglobin of 13 point 9, normal CMP creatinine 0.78, vitamin B12 1 1 7  3, vitamin D level L 4.2, mild elevated LDL 122, cholesterol was normal 189  Since early September 2017, she began to notice intermittent numbness initially at bilateral toes, when she took off her shoes walking on the floor barefooted, she fell numbness, but there was no tingling, no pain, no gait abnormality, she denies low back pain, neck pain, no bowel bladder incontinence. Over the past 3 weeks, she also noticed intermittent numbness involving left upper lip, ulnar side of bilateral hands, she can continue to walk a few miles without difficulty, but felt weaker afterwards.   She is on multiple over counter supplement, she complains intermittent dizziness usually contributed to her blood pressure medication list [place] 10 mg every day, she complains work related stress in the fall and winter months.  I personally reviewed MRI of the brain and MR a of brain in 2009 that was normal, no evidence of aneurysm  REVIEW OF SYSTEMS: Full 14 system review of systems performed and notable only for fatigue, murmur, numbness, dizziness,  anxiety  ALLERGIES: No Known Allergies  HOME MEDICATIONS: Current Outpatient Prescriptions  Medication Sig Dispense Refill  . ALPHA LIPOIC ACID PO Take 600 mg by mouth daily.    Marland Kitchen aspirin EC 81 MG tablet Take 1 tablet (81 mg total) by mouth daily. 30 tablet 3  . Biotin 1000 MCG tablet Take 1,000 mcg by mouth daily.    Marland Kitchen CALCIUM PO Take 500 mg by mouth daily.     . Cholecalciferol (VITAMIN D PO) Take 1,000 Units by mouth daily.     . COLLAGEN-VITAMIN C PO Take 500 mg by mouth daily.     Marland Kitchen lisinopril (PRINIVIL,ZESTRIL) 10 MG tablet TAKE 1 TABLET BY MOUTH DAILY. 30 tablet 1  . MAGNESIUM PO Take 650 mg by mouth daily.     . Multiple Vitamin (MULTIVITAMIN) capsule Take 1 capsule by mouth daily.    . Multiple Vitamins-Minerals (ZINC PO) Take 1 tablet by mouth daily.    . Multiple Vitamins-Minerals (ZINC PO) Take 50 mg by mouth.    . Omega-3 Fatty Acids (SEA-OMEGA 70 PO) Take 1,000 mg by mouth.     . Probiotic Product (PROBIOTIC DAILY PO) Take 1 tablet by mouth daily.    Marland Kitchen UNABLE TO FIND 2 tablets daily. Triphala for regularity.    Marland Kitchen UNABLE TO FIND daily. Ashwagandha for stress.     No current facility-administered medications for this visit.     PAST MEDICAL HISTORY: Past Medical History:  Diagnosis Date  . FOOT PAIN, LEFT   . HYPERLIPIDEMIA   .  Hypertension   . Numbness   . PERIMENOPAUSAL SYNDROME   . SINUSITIS, ACUTE     PAST SURGICAL HISTORY: History reviewed. No pertinent surgical history.  FAMILY HISTORY: Family History  Problem Relation Age of Onset  . Aneurysm Mother   . Aneurysm Father   . Lung cancer Brother   . Heart disease      uncles 73's and 24's  . Stroke    . Hypertension    . Neuropathy Brother     SOCIAL HISTORY:  Social History   Social History  . Marital status: Divorced    Spouse name: N/A  . Number of children: 2  . Years of education: Some College   Occupational History  . Insurance    Social History Main Topics  . Smoking status:  Never Smoker  . Smokeless tobacco: Never Used  . Alcohol use Yes     Comment: Social use only.  . Drug use: No  . Sexual activity: Not Currently   Other Topics Concern  . Not on file   Social History Narrative   Lives at home alone.   Right-haned.   4-5 cups coffee per day.     PHYSICAL EXAM   Vitals:   07/15/16 0923  BP: (!) 142/84  Pulse: 60  Weight: 139 lb (63 kg)  Height: 5\' 6"  (1.676 m)    Not recorded      Body mass index is 22.44 kg/m.  PHYSICAL EXAMNIATION:  Gen: NAD, conversant, well nourised, obese, well groomed                     Cardiovascular: Regular rate rhythm, no peripheral edema, warm, nontender. Eyes: Conjunctivae clear without exudates or hemorrhage Neck: Supple, no carotid bruise. Pulmonary: Clear to auscultation bilaterally   NEUROLOGICAL EXAM:  MENTAL STATUS: Speech:    Speech is normal; fluent and spontaneous with normal comprehension.  Cognition:     Orientation to time, place and person     Normal recent and remote memory     Normal Attention span and concentration     Normal Language, naming, repeating,spontaneous speech     Fund of knowledge   CRANIAL NERVES: CN II: Visual fields are full to confrontation. Fundoscopic exam is normal with sharp discs and no vascular changes. Pupils are round equal and briskly reactive to light. CN III, IV, VI: extraocular movement are normal. No ptosis. CN V: Facial sensation is intact to pinprick in all 3 divisions bilaterally. Corneal responses are intact.  CN VII: Face is symmetric with normal eye closure and smile. CN VIII: Hearing is normal to rubbing fingers CN IX, X: Palate elevates symmetrically. Phonation is normal. CN XI: Head turning and shoulder shrug are intact CN XII: Tongue is midline with normal movements and no atrophy.  MOTOR: There is no pronator drift of out-stretched arms. Muscle bulk and tone are normal. Muscle strength is normal.  REFLEXES: Reflexes are 2+ and  symmetric at the biceps, triceps, knees, and ankles. Plantar responses are flexor.  SENSORY: Intact to light touch, pinprick, positional sensation and vibratory sensation are intact in fingers and toes.  COORDINATION: Rapid alternating movements and fine finger movements are intact. There is no dysmetria on finger-to-nose and heel-knee-shin.    GAIT/STANCE: Posture is normal. Gait is steady with normal steps, base, arm swing, and turning. Heel and toe walking are normal. Tandem gait is normal.  Romberg is absent.   DIAGNOSTIC DATA (LABS, IMAGING, TESTING) - I reviewed patient  records, labs, notes, testing and imaging myself where available.   ASSESSMENT AND PLAN  Heather Summers is a 65 y.o. female   Intermittent numbness involving different body spots since early September 2017,  Normal neurological examinations,  After discussed with patient, we will proceed with laboratory evaluation  Return to clinic in 2 months, she remains symptomatic, may consider further evaluation such as EMG nerve conduction study and imaging study,   Marcial Pacas, M.D. Ph.D.  [facility] [address], [place], [place] [address] Ph: [phone] Fax: [phone]  CC: [name], MD

## 2016-07-16 LAB — ANA W/REFLEX IF POSITIVE
Anti JO-1: <0.2 AI (ref 0.0–0.9)
Anti Nuclear Antibody(ANA): POSITIVE — AB
Centromere Ab Screen: 0.5 AI (ref 0.0–0.9)
ENA SSA (RO) Ab: <0.2 AI (ref 0.0–0.9)
ENA SSB (LA) Ab: <0.2 AI (ref 0.0–0.9)
SCL 70: 2.7 AI — AB (ref 0.0–0.9)

## 2016-07-16 LAB — SEDIMENTATION RATE: Sed Rate: 2 mm/hr (ref 0–40)

## 2016-07-16 LAB — TSH: TSH: 2.57 u[IU]/mL (ref 0.450–4.500)

## 2016-07-16 LAB — LYME AB/WESTERN BLOT REFLEX

## 2016-07-16 LAB — C-REACTIVE PROTEIN: CRP: <0.3 mg/L (ref 0.0–4.9)

## 2016-07-17 ENCOUNTER — Encounter: Payer: Self-pay | Admitting: Neurology

## 2016-07-19 ENCOUNTER — Telehealth: Payer: Self-pay | Admitting: Neurology

## 2016-07-19 NOTE — Telephone Encounter (Signed)
Pt called request more information on the lab results

## 2016-07-19 NOTE — Telephone Encounter (Signed)
Spoke to patient - she is aware of her lab results, including abnormal ANA.  Per Dr. Krista Blue, provide lab results to PCP.  She will need repeat labs and likely a referral to rheumatology.

## 2016-09-20 ENCOUNTER — Ambulatory Visit (INDEPENDENT_AMBULATORY_CARE_PROVIDER_SITE_OTHER): Payer: Commercial Managed Care - HMO | Admitting: Neurology

## 2016-09-20 ENCOUNTER — Encounter: Payer: Self-pay | Admitting: Neurology

## 2016-09-20 VITALS — BP 128/78 | HR 62 | Ht 66.0 in | Wt 140.0 lb

## 2016-09-20 DIAGNOSIS — R202 Paresthesia of skin: Secondary | ICD-10-CM

## 2016-09-20 NOTE — Progress Notes (Signed)
PATIENT: Heather Summers DOB: 16-Nov-1950  Chief Complaint  Patient presents with  . Numbness    She is still having intermittent tingling in her hands and feet. She would like to review her labs today.     HISTORICAL  [name] is a 65 years old right-handed female, seen in refer by Her primary care physician Dr. [name] for evaluation of numbness in toes on July 15 2016  I reviewed and summarized the referring note, she had a history of hypertension, Hyperlipidemia, strong family history of brain aneurysm, both of her parents died of brain aneurysm, father disease at age 48, mother died at 78,   I also reviewed laboratory evaluation in July 2017 showed normal CBC, hemoglobin of 13 point 9, normal CMP creatinine 0.78, vitamin B12 1 1 7  3, vitamin D level L 4.2, mild elevated LDL 122, cholesterol was normal 189  Since early September 2017, she began to notice intermittent numbness initially at bilateral toes, when she took off her shoes walking on the floor barefooted, she fell numbness, but there was no tingling, no pain, no gait abnormality, she denies low back pain, neck pain, no bowel bladder incontinence. Over the past 3 weeks, she also noticed intermittent numbness involving left upper lip, ulnar side of bilateral hands, she can continue to walk a few miles without difficulty, but felt weaker afterwards.   She is on multiple over counter supplement, she complains intermittent dizziness usually contributed to her blood pressure medication list [place] 10 mg every day, she complains work related stress in the fall and winter months.  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     REVIEW OF SYSTEMS: Full 14 system review of systems performed and notable only for numbness  ALLERGIES: No Known Allergies  HOME MEDICATIONS: Current Outpatient Prescriptions  Medication Sig Dispense Refill  . ALPHA LIPOIC ACID PO Take 600 mg by mouth daily.    Marland Kitchen aspirin EC 81 MG tablet Take 1 tablet (81 mg total) by mouth daily. 30 tablet 3  . Biotin 1000 MCG tablet Take 5,000 mcg by mouth daily.     Marland Kitchen CALCIUM PO Take 500 mg by mouth daily.     . Cholecalciferol (VITAMIN D PO) Take 1,000 Units by mouth daily.     . cyanocobalamin 1000 MCG tablet Take 1,000 mcg by mouth daily.    Marland Kitchen lisinopril (PRINIVIL,ZESTRIL) 10 MG tablet TAKE 1 TABLET BY MOUTH DAILY. 30 tablet 1  . MAGNESIUM PO Take 650 mg by mouth daily.     . Multiple Vitamin (MULTIVITAMIN) capsule Take 1 capsule by mouth daily.    . Multiple Vitamins-Minerals (ZINC PO) Take 1 tablet by mouth daily.    . Multiple Vitamins-Minerals (ZINC PO) Take 50 mg by mouth.    . Omega-3 Fatty Acids (SEA-OMEGA 70 PO) Take 1,000 mg by mouth.     . Probiotic Product (PROBIOTIC DAILY PO) Take 1 tablet by mouth daily.    Marland Kitchen UNABLE TO FIND 2 tablets daily.  Triphala for regularity.     No current facility-administered medications for this visit.     PAST MEDICAL HISTORY: Past Medical History:  Diagnosis Date  . FOOT PAIN, LEFT   . HYPERLIPIDEMIA   . Hypertension   . Numbness   . PERIMENOPAUSAL SYNDROME   . SINUSITIS, ACUTE     PAST SURGICAL HISTORY: No past surgical history on file.  FAMILY HISTORY: Family History  Problem Relation Age of Onset  . Aneurysm Mother   . Aneurysm Father   . Lung cancer Brother   . Heart disease      uncles 45's and 36's  . Stroke    . Hypertension    . Neuropathy Brother     SOCIAL HISTORY:  Social History     Social History  . Marital status: Divorced    Spouse name: N/A  . Number of children: 2  . Years of education: Some College   Occupational History  . Insurance    Social History Main Topics  . Smoking status: Never Smoker  . Smokeless tobacco: Never Used  . Alcohol use Yes     Comment: Social use only.  . Drug use: No  . Sexual activity: Not Currently   Other Topics Concern  . Not on file   Social History Narrative   Lives at home alone.   Right-haned.   4-5 cups coffee per day.     PHYSICAL EXAM   Vitals:   09/20/16 1558  BP: 128/78  Pulse: 62  Weight: 140 lb (63.5 kg)  Height: 5' 6"  (1.676 m)    Not recorded      Body mass index is 22.6 kg/m.  PHYSICAL EXAMNIATION:  Gen: NAD, conversant, well nourised, obese, well groomed                     Cardiovascular: Regular rate rhythm, no peripheral edema, warm, nontender. Eyes: Conjunctivae clear without exudates or hemorrhage Neck: Supple, no carotid bruise. Pulmonary: Clear to auscultation bilaterally   NEUROLOGICAL EXAM:  MENTAL STATUS: Speech:    Speech is normal; fluent and spontaneous with normal comprehension.  Cognition:     Orientation to time, place and person     Normal recent and remote memory     Normal Attention span and concentration     Normal Language, naming, repeating,spontaneous speech     Fund of knowledge   CRANIAL NERVES: CN II: Visual fields are full to confrontation. Fundoscopic exam is normal with sharp discs and no vascular changes. Pupils are round equal and briskly reactive to light. CN III, IV, VI: extraocular movement are normal. No ptosis. CN V: Facial sensation is intact to pinprick in all 3 divisions bilaterally. Corneal responses are intact.  CN VII: Face is symmetric with normal eye closure and smile. CN VIII: Hearing is normal to rubbing fingers CN IX, X: Palate elevates symmetrically. Phonation is normal. CN XI: Head turning and shoulder shrug are intact CN  XII: Tongue is midline with normal movements and no atrophy.  MOTOR: There is no pronator drift of out-stretched arms. Muscle bulk and tone are normal. Muscle strength is normal.  REFLEXES: Reflexes are 2+ and symmetric at the biceps, triceps, knees, and ankles. Plantar responses are flexor.  SENSORY: Intact to light touch, pinprick, positional sensation and vibratory sensation are intact in fingers and toes.  COORDINATION: Rapid alternating movements and fine finger movements are intact. There is no dysmetria on finger-to-nose and heel-knee-shin.    GAIT/STANCE:  Posture is normal. Gait is steady with normal steps, base, arm swing, and turning. Heel and toe walking are normal. Tandem gait is normal.  Romberg is absent.   DIAGNOSTIC DATA (LABS, IMAGING, TESTING) - I reviewed patient records, labs, notes, testing and imaging myself where available.   ASSESSMENT AND PLAN  QUINTESSA SIMMERMAN is a 64 y.o. female   Intermittent numbness involving different body spots since early September 2017,  Normal neurological examinations,  Laboratory evaluation showed positive ANA, scleroderma antibody, but there was no evidence of skin changes, was seen by rheumatologist already.  No treatable cause identified, after discussed with patient, we decided to continue observe, call clinic for worsening symptoms.   Marcial Pacas, M.D. Ph.D.  [facility] [address], [place], [place] [address] Ph: [phone] Fax: [phone]  CC: [name], MD

## 2017-01-17 ENCOUNTER — Other Ambulatory Visit: Payer: Self-pay | Admitting: Obstetrics and Gynecology

## 2017-01-17 DIAGNOSIS — Z124 Encounter for screening for malignant neoplasm of cervix: Secondary | ICD-10-CM | POA: Diagnosis not present

## 2017-01-17 DIAGNOSIS — Z1231 Encounter for screening mammogram for malignant neoplasm of breast: Secondary | ICD-10-CM | POA: Diagnosis not present

## 2017-01-20 LAB — CYTOLOGY - PAP

## 2017-05-24 DIAGNOSIS — G629 Polyneuropathy, unspecified: Secondary | ICD-10-CM | POA: Diagnosis not present

## 2017-05-24 DIAGNOSIS — M8580 Other specified disorders of bone density and structure, unspecified site: Secondary | ICD-10-CM | POA: Diagnosis not present

## 2017-05-24 DIAGNOSIS — E559 Vitamin D deficiency, unspecified: Secondary | ICD-10-CM | POA: Diagnosis not present

## 2017-05-24 DIAGNOSIS — Z79899 Other long term (current) drug therapy: Secondary | ICD-10-CM | POA: Diagnosis not present

## 2017-05-24 DIAGNOSIS — Z0000 Encounter for general adult medical examination without abnormal findings: Secondary | ICD-10-CM | POA: Diagnosis not present

## 2017-05-24 DIAGNOSIS — Z23 Encounter for immunization: Secondary | ICD-10-CM | POA: Diagnosis not present

## 2017-05-24 DIAGNOSIS — I10 Essential (primary) hypertension: Secondary | ICD-10-CM | POA: Diagnosis not present

## 2017-05-24 DIAGNOSIS — E785 Hyperlipidemia, unspecified: Secondary | ICD-10-CM | POA: Diagnosis not present

## 2017-05-24 DIAGNOSIS — Z1389 Encounter for screening for other disorder: Secondary | ICD-10-CM | POA: Diagnosis not present

## 2017-07-04 DIAGNOSIS — M545 Low back pain: Secondary | ICD-10-CM | POA: Diagnosis not present

## 2017-07-04 DIAGNOSIS — R350 Frequency of micturition: Secondary | ICD-10-CM | POA: Diagnosis not present

## 2017-08-12 DIAGNOSIS — Z23 Encounter for immunization: Secondary | ICD-10-CM | POA: Diagnosis not present

## 2017-08-26 DIAGNOSIS — D2271 Melanocytic nevi of right lower limb, including hip: Secondary | ICD-10-CM | POA: Diagnosis not present

## 2017-08-26 DIAGNOSIS — L821 Other seborrheic keratosis: Secondary | ICD-10-CM | POA: Diagnosis not present

## 2017-08-26 DIAGNOSIS — D1801 Hemangioma of skin and subcutaneous tissue: Secondary | ICD-10-CM | POA: Diagnosis not present

## 2017-08-26 DIAGNOSIS — D225 Melanocytic nevi of trunk: Secondary | ICD-10-CM | POA: Diagnosis not present

## 2017-08-26 DIAGNOSIS — L905 Scar conditions and fibrosis of skin: Secondary | ICD-10-CM | POA: Diagnosis not present

## 2017-08-26 DIAGNOSIS — D2272 Melanocytic nevi of left lower limb, including hip: Secondary | ICD-10-CM | POA: Diagnosis not present

## 2017-12-22 DIAGNOSIS — J069 Acute upper respiratory infection, unspecified: Secondary | ICD-10-CM | POA: Diagnosis not present

## 2017-12-26 DIAGNOSIS — H9222 Otorrhagia, left ear: Secondary | ICD-10-CM | POA: Diagnosis not present

## 2017-12-26 DIAGNOSIS — H66002 Acute suppurative otitis media without spontaneous rupture of ear drum, left ear: Secondary | ICD-10-CM | POA: Diagnosis not present

## 2017-12-26 DIAGNOSIS — H906 Mixed conductive and sensorineural hearing loss, bilateral: Secondary | ICD-10-CM | POA: Diagnosis not present

## 2017-12-26 DIAGNOSIS — H6983 Other specified disorders of Eustachian tube, bilateral: Secondary | ICD-10-CM | POA: Diagnosis not present

## 2018-01-17 DIAGNOSIS — H7292 Unspecified perforation of tympanic membrane, left ear: Secondary | ICD-10-CM | POA: Diagnosis not present

## 2018-01-17 DIAGNOSIS — H6642 Suppurative otitis media, unspecified, left ear: Secondary | ICD-10-CM | POA: Diagnosis not present

## 2018-01-20 DIAGNOSIS — Z1231 Encounter for screening mammogram for malignant neoplasm of breast: Secondary | ICD-10-CM | POA: Diagnosis not present

## 2018-03-01 DIAGNOSIS — H7292 Unspecified perforation of tympanic membrane, left ear: Secondary | ICD-10-CM | POA: Diagnosis not present

## 2018-03-01 DIAGNOSIS — H6642 Suppurative otitis media, unspecified, left ear: Secondary | ICD-10-CM | POA: Diagnosis not present

## 2018-05-31 DIAGNOSIS — Z1389 Encounter for screening for other disorder: Secondary | ICD-10-CM | POA: Diagnosis not present

## 2018-05-31 DIAGNOSIS — R202 Paresthesia of skin: Secondary | ICD-10-CM | POA: Diagnosis not present

## 2018-05-31 DIAGNOSIS — Z7189 Other specified counseling: Secondary | ICD-10-CM | POA: Diagnosis not present

## 2018-05-31 DIAGNOSIS — R7989 Other specified abnormal findings of blood chemistry: Secondary | ICD-10-CM | POA: Diagnosis not present

## 2018-05-31 DIAGNOSIS — E785 Hyperlipidemia, unspecified: Secondary | ICD-10-CM | POA: Diagnosis not present

## 2018-05-31 DIAGNOSIS — Z1211 Encounter for screening for malignant neoplasm of colon: Secondary | ICD-10-CM | POA: Diagnosis not present

## 2018-05-31 DIAGNOSIS — I10 Essential (primary) hypertension: Secondary | ICD-10-CM | POA: Diagnosis not present

## 2018-05-31 DIAGNOSIS — G629 Polyneuropathy, unspecified: Secondary | ICD-10-CM | POA: Diagnosis not present

## 2018-05-31 DIAGNOSIS — M8588 Other specified disorders of bone density and structure, other site: Secondary | ICD-10-CM | POA: Diagnosis not present

## 2018-05-31 DIAGNOSIS — Z0000 Encounter for general adult medical examination without abnormal findings: Secondary | ICD-10-CM | POA: Diagnosis not present

## 2018-05-31 DIAGNOSIS — M8580 Other specified disorders of bone density and structure, unspecified site: Secondary | ICD-10-CM | POA: Diagnosis not present

## 2018-06-21 DIAGNOSIS — R509 Fever, unspecified: Secondary | ICD-10-CM | POA: Diagnosis not present

## 2018-06-21 DIAGNOSIS — J069 Acute upper respiratory infection, unspecified: Secondary | ICD-10-CM | POA: Diagnosis not present

## 2018-06-28 DIAGNOSIS — H6691 Otitis media, unspecified, right ear: Secondary | ICD-10-CM | POA: Diagnosis not present

## 2018-06-29 DIAGNOSIS — H1033 Unspecified acute conjunctivitis, bilateral: Secondary | ICD-10-CM | POA: Diagnosis not present

## 2018-06-29 DIAGNOSIS — B309 Viral conjunctivitis, unspecified: Secondary | ICD-10-CM | POA: Diagnosis not present

## 2018-07-04 DIAGNOSIS — R194 Change in bowel habit: Secondary | ICD-10-CM | POA: Diagnosis not present

## 2018-07-04 DIAGNOSIS — Z1211 Encounter for screening for malignant neoplasm of colon: Secondary | ICD-10-CM | POA: Diagnosis not present

## 2018-07-17 DIAGNOSIS — M8588 Other specified disorders of bone density and structure, other site: Secondary | ICD-10-CM | POA: Diagnosis not present

## 2018-07-27 DIAGNOSIS — Z23 Encounter for immunization: Secondary | ICD-10-CM | POA: Diagnosis not present

## 2018-08-21 ENCOUNTER — Encounter: Payer: Self-pay | Admitting: Gastroenterology

## 2018-08-24 DIAGNOSIS — K644 Residual hemorrhoidal skin tags: Secondary | ICD-10-CM | POA: Diagnosis not present

## 2018-08-24 DIAGNOSIS — K640 First degree hemorrhoids: Secondary | ICD-10-CM | POA: Diagnosis not present

## 2018-08-24 DIAGNOSIS — D126 Benign neoplasm of colon, unspecified: Secondary | ICD-10-CM | POA: Diagnosis not present

## 2018-08-24 DIAGNOSIS — Z1211 Encounter for screening for malignant neoplasm of colon: Secondary | ICD-10-CM | POA: Diagnosis not present

## 2018-08-29 DIAGNOSIS — D126 Benign neoplasm of colon, unspecified: Secondary | ICD-10-CM | POA: Diagnosis not present

## 2018-09-27 DIAGNOSIS — J329 Chronic sinusitis, unspecified: Secondary | ICD-10-CM | POA: Diagnosis not present

## 2018-09-27 DIAGNOSIS — H6691 Otitis media, unspecified, right ear: Secondary | ICD-10-CM | POA: Diagnosis not present

## 2018-10-11 DIAGNOSIS — D2271 Melanocytic nevi of right lower limb, including hip: Secondary | ICD-10-CM | POA: Diagnosis not present

## 2018-10-11 DIAGNOSIS — D224 Melanocytic nevi of scalp and neck: Secondary | ICD-10-CM | POA: Diagnosis not present

## 2018-10-11 DIAGNOSIS — D1801 Hemangioma of skin and subcutaneous tissue: Secondary | ICD-10-CM | POA: Diagnosis not present

## 2018-10-11 DIAGNOSIS — D2262 Melanocytic nevi of left upper limb, including shoulder: Secondary | ICD-10-CM | POA: Diagnosis not present

## 2018-10-11 DIAGNOSIS — D2272 Melanocytic nevi of left lower limb, including hip: Secondary | ICD-10-CM | POA: Diagnosis not present

## 2018-10-11 DIAGNOSIS — L814 Other melanin hyperpigmentation: Secondary | ICD-10-CM | POA: Diagnosis not present

## 2018-10-11 DIAGNOSIS — D225 Melanocytic nevi of trunk: Secondary | ICD-10-CM | POA: Diagnosis not present

## 2018-10-11 DIAGNOSIS — D2261 Melanocytic nevi of right upper limb, including shoulder: Secondary | ICD-10-CM | POA: Diagnosis not present

## 2018-10-11 DIAGNOSIS — L821 Other seborrheic keratosis: Secondary | ICD-10-CM | POA: Diagnosis not present

## 2018-11-14 DIAGNOSIS — M1611 Unilateral primary osteoarthritis, right hip: Secondary | ICD-10-CM | POA: Diagnosis not present

## 2018-11-14 DIAGNOSIS — M47816 Spondylosis without myelopathy or radiculopathy, lumbar region: Secondary | ICD-10-CM | POA: Diagnosis not present

## 2019-04-11 DIAGNOSIS — Z124 Encounter for screening for malignant neoplasm of cervix: Secondary | ICD-10-CM | POA: Diagnosis not present

## 2019-04-11 DIAGNOSIS — Z1231 Encounter for screening mammogram for malignant neoplasm of breast: Secondary | ICD-10-CM | POA: Diagnosis not present

## 2019-04-12 ENCOUNTER — Other Ambulatory Visit: Payer: Self-pay | Admitting: Obstetrics and Gynecology

## 2019-04-12 DIAGNOSIS — R928 Other abnormal and inconclusive findings on diagnostic imaging of breast: Secondary | ICD-10-CM

## 2019-04-18 ENCOUNTER — Ambulatory Visit
Admission: RE | Admit: 2019-04-18 | Discharge: 2019-04-18 | Disposition: A | Discharge status: Home / Self Care | Payer: Medicare Other | Source: Ambulatory Visit | Attending: Obstetrics and Gynecology | Admitting: Obstetrics and Gynecology

## 2019-04-18 ENCOUNTER — Other Ambulatory Visit: Payer: Self-pay | Admitting: Obstetrics and Gynecology

## 2019-04-18 ENCOUNTER — Other Ambulatory Visit: Payer: Self-pay

## 2019-04-18 DIAGNOSIS — N6321 Unspecified lump in the left breast, upper outer quadrant: Secondary | ICD-10-CM | POA: Diagnosis not present

## 2019-04-18 DIAGNOSIS — R928 Other abnormal and inconclusive findings on diagnostic imaging of breast: Secondary | ICD-10-CM | POA: Diagnosis not present

## 2019-04-18 DIAGNOSIS — N6320 Unspecified lump in the left breast, unspecified quadrant: Secondary | ICD-10-CM

## 2019-04-18 DIAGNOSIS — N6322 Unspecified lump in the left breast, upper inner quadrant: Secondary | ICD-10-CM | POA: Diagnosis not present

## 2019-04-19 ENCOUNTER — Ambulatory Visit
Admission: RE | Admit: 2019-04-19 | Discharge: 2019-04-19 | Disposition: A | Discharge status: Home / Self Care | Payer: Medicare Other | Source: Ambulatory Visit | Attending: Obstetrics and Gynecology | Admitting: Obstetrics and Gynecology

## 2019-04-19 ENCOUNTER — Other Ambulatory Visit: Payer: Self-pay

## 2019-04-19 DIAGNOSIS — R928 Other abnormal and inconclusive findings on diagnostic imaging of breast: Secondary | ICD-10-CM

## 2019-04-19 DIAGNOSIS — N6320 Unspecified lump in the left breast, unspecified quadrant: Secondary | ICD-10-CM

## 2019-04-19 DIAGNOSIS — N6325 Unspecified lump in the left breast, overlapping quadrants: Secondary | ICD-10-CM | POA: Diagnosis not present

## 2019-04-19 DIAGNOSIS — N6092 Unspecified benign mammary dysplasia of left breast: Secondary | ICD-10-CM | POA: Diagnosis not present

## 2019-04-30 ENCOUNTER — Ambulatory Visit: Payer: Self-pay | Admitting: Surgery

## 2019-04-30 DIAGNOSIS — N6320 Unspecified lump in the left breast, unspecified quadrant: Secondary | ICD-10-CM

## 2019-04-30 DIAGNOSIS — N6092 Unspecified benign mammary dysplasia of left breast: Secondary | ICD-10-CM | POA: Diagnosis not present

## 2019-04-30 NOTE — H&P (Signed)
History of Present Illness Heather Summers. Heather Steil MD; 04/30/2019 12:30 PM) The patient is a 68 year old female who presents with a breast mass. Referred by Dr. Vanessa Summers for left breast mass PCP - Heather Summers  This is a 68 year old female in good health who presents after routine screening mammogram. In the superior anterior left breast there was a new mass that was further evaluated with compression views and ultrasound. This showed a 6 x 3 x 5 mm well circumscribed hypoechoic mass in the left breast 12:00 1 cm from the nipple. She underwent ultrasound-guided biopsy on 04/19/19. 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 The patient is now referred to [place] to discuss excision.  CLINICAL DATA: Patient recalled from screening for left breast mass. EXAM: DIGITAL DIAGNOSTIC LEFT MAMMOGRAM WITH CAD AND TOMO ULTRASOUND LEFT BREAST COMPARISON: Previous exam(s). ACR Breast Density Category c: The breast tissue is heterogeneously dense, which may obscure small masses. FINDINGS: Within the superior anterior left breast slightly medially there is a new lobular mass, further evaluated with spot compression CC and MLO tomosynthesis images. Mammographic images were processed with CAD. Targeted ultrasound is performed, showing a 6 x 3 x 5 mm oval circumscribed hypoechoic mass left breast 12 o'clock position 1 cm from the nipple. No left axillary adenopathy. IMPRESSION: Indeterminate left breast mass 12 o'clock position. RECOMMENDATION: Ultrasound-guided core needle biopsy indeterminate left breast mass. I have discussed the findings and recommendations with the patient. Results were also provided in writing at the conclusion of the visit. If applicable, a reminder letter will be sent to the patient regarding the next appointment. BI-RADS CATEGORY 4: Suspicious. Electronically Signed By: Heather Summers M.D. On: 04/18/2019 10:02   CLINICAL DATA:  68 year old female for tissue sampling of 0.6 cm UPPER LEFT breast mass.  EXAM: ULTRASOUND GUIDED LEFT BREAST CORE NEEDLE BIOPSY  COMPARISON: Previous exam(s).  FINDINGS: I met with the patient and we discussed the procedure of ultrasound-guided biopsy, including benefits and alternatives. We discussed the high likelihood of a successful procedure. We discussed the risks of the procedure, including infection, bleeding, tissue injury, clip migration, and inadequate sampling. Informed written consent was given. The usual time-out protocol was performed immediately prior to the procedure.  Using sterile technique and 1% Lidocaine as local anesthetic, under direct ultrasound visualization, a 12 gauge spring-loaded device was used to perform biopsy of 0.6 cm hypoechoic mass at the 12 o'clock position of the LEFT breast 1 cm from the nipple using a MEDIAL approach. At the conclusion of the procedure a RIBBON tissue marker clip was deployed into the biopsy cavity. Follow up 2 view mammogram was performed and dictated separately.  IMPRESSION: Ultrasound guided biopsy of the 0.6 cm UPPER LEFT breast mass. No apparent complications.  Electronically Signed: By: Heather Summers M.D. On: 04/19/2019 16:40  CLINICAL DATA: Evaluate RIBBON clip placement following ultrasound-guided LEFT breast biopsy.  EXAM: DIAGNOSTIC LEFT MAMMOGRAM POST ULTRASOUND BIOPSY  COMPARISON: Previous exam(s).  FINDINGS: Mammographic images were obtained following ultrasound guided biopsy of the 0.6 cm mass within the UPPER LEFT breast.  The RIBBON clip is in satisfactory position adjacent to the mass.  IMPRESSION: Satisfactory RIBBON clip position following ultrasound-guided LEFT breast biopsy.  Final Assessment: Post Procedure Mammograms for Marker Placement   Electronically Signed By: Heather Summers M.D. On: 04/19/2019 16:41      Problem List/Past Medical Heather Key K. Haydan Wedig, MD; 04/30/2019 12:31  PM) MASS OF LEFT BREAST ON MAMMOGRAM (N63.20)  Past Surgical History (Heather Summers, [name];  04/30/2019 10:57 AM) Colon Polyp Removal - Colonoscopy  Diagnostic Studies History (Heather Summers, [name]; 04/30/2019 10:57 AM) Colonoscopy within last year Mammogram within last year  Allergies (Heather Summers, [name]; 04/30/2019 10:58 AM) No Known Drug Allergies [04/30/2019]: Allergies Reconciled  Medication History (Heather Summers, [name]; 04/30/2019 11:00 AM) Alpha Lipoic Acid (200MG Capsule, Oral) Active. Ashwagandha (500MG Capsule, Oral) Active. Biotin (10MG Tablet, Oral) Active. Co Q-10 (100MG Capsule, Oral) Active. Lisinopril (10MG Tablet, Oral) Active. Magnesium (200MG Tablet, Oral) Active. Multivitamin (Oral) Active. Vitamin C (500MG Capsule, Oral) Active. Vitamin D (Cholecalciferol) (25 MCG(1000 UT) Capsule, Oral) Active. Medications Reconciled  Social History (Heather Summers, [name]; 04/30/2019 10:57 AM) Alcohol use Occasional alcohol use. Caffeine use Coffee, Tea. No drug use Tobacco use Never smoker.  Family History (Heather Summers, [name]; 04/30/2019 10:57 AM) Cancer Brother. Cerebrovascular Accident Father, Mother. Migraine Headache Daughter, Father.  Pregnancy / Birth History (Heather Summers, [name]; 04/30/2019 10:57 AM) Age at menarche 68 years. Age of menopause 37-55 Gravida 2 Length (months) of breastfeeding 12-24 Maternal age 15-20 Para 2  Other Problems Heather Summers. Heather Rotert, MD; 04/30/2019 12:31 PM) Arthritis Heart murmur Hemorrhoids High blood pressure Migraine Headache     Review of Systems (Heather A. Brown [name]; 04/30/2019 10:57 AM) General Not Present- Appetite Loss, Chills, Fatigue, Fever, Night Sweats, Weight Gain and Weight Loss. Skin Not Present- Change in Wart/Mole, Dryness, Hives, Jaundice, New Lesions, Non-Healing Wounds, Rash and Ulcer. HEENT Present- Wears glasses/contact lenses. Not Present- Earache, Hearing Loss,  Hoarseness, Nose Bleed, Oral Ulcers, Ringing in the Ears, Seasonal Allergies, Sinus Pain, Sore Throat, Visual Disturbances and Yellow Eyes. Respiratory Not Present- Bloody sputum, Chronic Cough, Difficulty Breathing, Snoring and Wheezing. Breast Not Present- Breast Mass, Breast Pain, Nipple Discharge and Skin Changes. Cardiovascular Present- Leg Cramps. Not Present- Chest Pain, Difficulty Breathing Lying Down, Palpitations, Rapid Heart Rate, Shortness of Breath and Swelling of Extremities. Gastrointestinal Present- Hemorrhoids. Not Present- Abdominal Pain, Bloating, Bloody Stool, Change in Bowel Habits, Chronic diarrhea, Constipation, Difficulty Swallowing, Excessive gas, Gets full quickly at meals, Indigestion, Nausea, Rectal Pain and Vomiting. Female Genitourinary Not Present- Frequency, Nocturia, Painful Urination, Pelvic Pain and Urgency. Musculoskeletal Present- Joint Pain. Not Present- Back Pain, Joint Stiffness, Muscle Pain, Muscle Weakness and Swelling of Extremities. Neurological Present- Tingling. Not Present- Decreased Memory, Fainting, Headaches, Numbness, Seizures, Tremor, Trouble walking and Weakness. Psychiatric Present- Anxiety. Not Present- Bipolar, Change in Sleep Pattern, Depression, Fearful and Frequent crying. Endocrine Present- Hot flashes. Not Present- Cold Intolerance, Excessive Hunger, Hair Changes, Heat Intolerance and New Diabetes. Hematology Not Present- Blood Thinners, Easy Bruising, Excessive bleeding, Gland problems, HIV and Persistent Infections.  Vitals (Heather A. Brown [name]; 04/30/2019 10:58 AM) 04/30/2019 10:57 AM Weight: 144.2 lb Height: 65in Body Surface Area: 1.72 m Body Mass Index: 24 kg/m  Temp.: 97.65F  Pulse: 78 (Regular)  BP: 124/84 (Sitting, Left Arm, Standard)        Physical Exam Heather Key K. Deundra Furber MD; 04/30/2019 12:31 PM)  The physical exam findings are as follows: Note:WDWN in NAD Eyes: Pupils equal, round; sclera anicteric HENT:  Oral mucosa moist; good dentition Neck: No masses palpated, no thyromegaly Lungs: CTA bilaterally; normal respiratory effort Breasts: symmetric; no nipple retraction or discharge; no axillary lymphadenopathy; no palpable masses in either breast; healing biopsy site CV: Regular rate and rhythm; no murmurs; extremities well-perfused with no edema Abd: +bowel sounds, soft, non-tender, no palpable organomegaly; no palpable hernias Skin: Warm, dry; no sign of jaundice Psychiatric - alert and oriented x  4; calm mood and affect    Assessment & Plan Heather Key K. Michala Deblanc MD; 04/30/2019 12:32 PM)  MASS OF LEFT BREAST ON MAMMOGRAM (N63.20) Impression: Complex sclerosing lesion 12:00 1 cmfn  Current Plans Schedule for Surgery - left radioactive seed localized lumpectomy. The surgical procedure has been discussed with the patient. Potential risks, benefits, alternative treatments, and expected outcomes have been explained. All of the patient's questions at this time have been answered. The likelihood of reaching the patient's treatment goal is good. The patient understand the proposed surgical procedure and wishes to proceed.  ATYPICAL DUCTAL HYPERPLASIA OF LEFT BREAST (N60.92)  Heather Summers. Georgette Dover, MD, [facility]  General/ Trauma Surgery Beeper [phone]  04/30/2019 12:32 PM

## 2019-05-02 ENCOUNTER — Other Ambulatory Visit: Payer: Self-pay | Admitting: Surgery

## 2019-05-02 DIAGNOSIS — N6320 Unspecified lump in the left breast, unspecified quadrant: Secondary | ICD-10-CM

## 2019-05-29 ENCOUNTER — Encounter (HOSPITAL_BASED_OUTPATIENT_CLINIC_OR_DEPARTMENT_OTHER): Payer: Self-pay | Admitting: *Deleted

## 2019-05-29 ENCOUNTER — Other Ambulatory Visit: Payer: Self-pay

## 2019-05-31 ENCOUNTER — Other Ambulatory Visit: Payer: Self-pay

## 2019-05-31 ENCOUNTER — Encounter (HOSPITAL_BASED_OUTPATIENT_CLINIC_OR_DEPARTMENT_OTHER)
Admission: RE | Admit: 2019-05-31 | Discharge: 2019-05-31 | Disposition: A | Discharge status: Home / Self Care | Payer: Medicare Other | Source: Ambulatory Visit | Attending: Surgery | Admitting: Surgery

## 2019-05-31 DIAGNOSIS — Z20828 Contact with and (suspected) exposure to other viral communicable diseases: Secondary | ICD-10-CM | POA: Insufficient documentation

## 2019-05-31 DIAGNOSIS — Z01818 Encounter for other preprocedural examination: Secondary | ICD-10-CM | POA: Insufficient documentation

## 2019-05-31 DIAGNOSIS — I10 Essential (primary) hypertension: Secondary | ICD-10-CM | POA: Diagnosis not present

## 2019-05-31 DIAGNOSIS — R001 Bradycardia, unspecified: Secondary | ICD-10-CM | POA: Insufficient documentation

## 2019-05-31 NOTE — Progress Notes (Signed)

## 2019-06-02 ENCOUNTER — Other Ambulatory Visit (HOSPITAL_COMMUNITY)
Admission: RE | Admit: 2019-06-02 | Discharge: 2019-06-02 | Disposition: A | Discharge status: Home / Self Care | Payer: Medicare Other | Source: Ambulatory Visit | Attending: Surgery | Admitting: Surgery

## 2019-06-02 DIAGNOSIS — Z20828 Contact with and (suspected) exposure to other viral communicable diseases: Secondary | ICD-10-CM | POA: Diagnosis not present

## 2019-06-02 DIAGNOSIS — I10 Essential (primary) hypertension: Secondary | ICD-10-CM | POA: Diagnosis not present

## 2019-06-02 DIAGNOSIS — Z01818 Encounter for other preprocedural examination: Secondary | ICD-10-CM | POA: Diagnosis not present

## 2019-06-02 DIAGNOSIS — R001 Bradycardia, unspecified: Secondary | ICD-10-CM | POA: Diagnosis not present

## 2019-06-02 LAB — SARS CORONAVIRUS 2 (TAT 6-24 HRS): SARS Coronavirus 2: NEGATIVE

## 2019-06-05 ENCOUNTER — Other Ambulatory Visit: Payer: Self-pay

## 2019-06-05 ENCOUNTER — Ambulatory Visit
Admission: RE | Admit: 2019-06-05 | Discharge: 2019-06-05 | Disposition: A | Discharge status: Home / Self Care | Payer: Medicare Other | Source: Ambulatory Visit | Attending: Surgery | Admitting: Surgery

## 2019-06-05 ENCOUNTER — Other Ambulatory Visit: Payer: Self-pay | Admitting: Surgery

## 2019-06-05 DIAGNOSIS — N6320 Unspecified lump in the left breast, unspecified quadrant: Secondary | ICD-10-CM

## 2019-06-05 DIAGNOSIS — N6092 Unspecified benign mammary dysplasia of left breast: Secondary | ICD-10-CM | POA: Diagnosis not present

## 2019-06-06 ENCOUNTER — Ambulatory Visit
Admission: RE | Admit: 2019-06-06 | Discharge: 2019-06-06 | Disposition: A | Discharge status: Home / Self Care | Payer: Medicare Other | Source: Ambulatory Visit | Attending: Surgery | Admitting: Surgery

## 2019-06-06 ENCOUNTER — Ambulatory Visit (HOSPITAL_BASED_OUTPATIENT_CLINIC_OR_DEPARTMENT_OTHER): Payer: Medicare Other | Admitting: Certified Registered"

## 2019-06-06 ENCOUNTER — Encounter (HOSPITAL_BASED_OUTPATIENT_CLINIC_OR_DEPARTMENT_OTHER): Payer: Self-pay

## 2019-06-06 ENCOUNTER — Other Ambulatory Visit: Payer: Self-pay

## 2019-06-06 ENCOUNTER — Ambulatory Visit (HOSPITAL_BASED_OUTPATIENT_CLINIC_OR_DEPARTMENT_OTHER)
Admission: RE | Admit: 2019-06-06 | Discharge: 2019-06-06 | Disposition: A | Discharge status: Home / Self Care | Payer: Medicare Other | Attending: Surgery | Admitting: Surgery

## 2019-06-06 ENCOUNTER — Encounter (HOSPITAL_BASED_OUTPATIENT_CLINIC_OR_DEPARTMENT_OTHER)
Admission: RE | Disposition: A | Discharge status: Home / Self Care | Payer: Self-pay | Source: Home / Self Care | Attending: Surgery

## 2019-06-06 DIAGNOSIS — Z79899 Other long term (current) drug therapy: Secondary | ICD-10-CM | POA: Diagnosis not present

## 2019-06-06 DIAGNOSIS — N6012 Diffuse cystic mastopathy of left breast: Secondary | ICD-10-CM | POA: Diagnosis not present

## 2019-06-06 DIAGNOSIS — M1990 Unspecified osteoarthritis, unspecified site: Secondary | ICD-10-CM | POA: Diagnosis not present

## 2019-06-06 DIAGNOSIS — E785 Hyperlipidemia, unspecified: Secondary | ICD-10-CM | POA: Diagnosis not present

## 2019-06-06 DIAGNOSIS — N6320 Unspecified lump in the left breast, unspecified quadrant: Secondary | ICD-10-CM | POA: Diagnosis present

## 2019-06-06 DIAGNOSIS — N6092 Unspecified benign mammary dysplasia of left breast: Secondary | ICD-10-CM | POA: Diagnosis not present

## 2019-06-06 DIAGNOSIS — N6022 Fibroadenosis of left breast: Secondary | ICD-10-CM | POA: Insufficient documentation

## 2019-06-06 DIAGNOSIS — N6489 Other specified disorders of breast: Secondary | ICD-10-CM | POA: Insufficient documentation

## 2019-06-06 DIAGNOSIS — I10 Essential (primary) hypertension: Secondary | ICD-10-CM | POA: Insufficient documentation

## 2019-06-06 HISTORY — PX: BREAST LUMPECTOMY WITH RADIOACTIVE SEED LOCALIZATION: SHX6424

## 2019-06-06 HISTORY — DX: Anxiety disorder, unspecified: F41.9

## 2019-06-06 HISTORY — DX: Unspecified lump in the left breast, unspecified quadrant: N63.20

## 2019-06-06 SURGERY — BREAST LUMPECTOMY WITH RADIOACTIVE SEED LOCALIZATION
Anesthesia: General | Site: Breast | Laterality: Left

## 2019-06-06 MED ORDER — PROPOFOL 10 MG/ML IV BOLUS
INTRAVENOUS | Status: AC
  Filled 2019-06-06: qty 20

## 2019-06-06 MED ORDER — FENTANYL CITRATE (PF) 100 MCG/2ML IJ SOLN: 50.0000 ug | INTRAMUSCULAR | Status: DC | PRN

## 2019-06-06 MED ORDER — DEXAMETHASONE SODIUM PHOSPHATE 4 MG/ML IJ SOLN
INTRAMUSCULAR | Status: DC | PRN
  Administered 2019-06-06: 10 mg via INTRAVENOUS

## 2019-06-06 MED ORDER — MIDAZOLAM HCL 2 MG/2ML IJ SOLN
INTRAMUSCULAR | Status: AC
  Filled 2019-06-06: qty 2

## 2019-06-06 MED ORDER — OXYCODONE HCL 5 MG/5ML PO SOLN: 5.0000 mg | Freq: Once | ORAL | Status: DC | PRN

## 2019-06-06 MED ORDER — LIDOCAINE 2% (20 MG/ML) 5 ML SYRINGE
INTRAMUSCULAR | Status: AC
  Filled 2019-06-06: qty 10

## 2019-06-06 MED ORDER — MIDAZOLAM HCL 2 MG/2ML IJ SOLN: 1.0000 mg | INTRAMUSCULAR | Status: DC | PRN

## 2019-06-06 MED ORDER — GABAPENTIN 300 MG PO CAPS
ORAL_CAPSULE | ORAL | Status: AC
  Filled 2019-06-06: qty 1

## 2019-06-06 MED ORDER — BUPIVACAINE-EPINEPHRINE 0.25% -1:200000 IJ SOLN
INTRAMUSCULAR | Status: DC | PRN
  Administered 2019-06-06: 10 mL

## 2019-06-06 MED ORDER — CHLORHEXIDINE GLUCONATE CLOTH 2 % EX PADS: 6.0000 | MEDICATED_PAD | Freq: Once | CUTANEOUS | Status: DC

## 2019-06-06 MED ORDER — KETOROLAC TROMETHAMINE 30 MG/ML IJ SOLN
INTRAMUSCULAR | Status: DC | PRN
  Administered 2019-06-06: 20 mg via INTRAVENOUS

## 2019-06-06 MED ORDER — GABAPENTIN 300 MG PO CAPS
300.0000 mg | ORAL_CAPSULE | ORAL | Status: AC
  Administered 2019-06-06: 300 mg via ORAL

## 2019-06-06 MED ORDER — ACETAMINOPHEN 500 MG PO TABS
ORAL_TABLET | ORAL | Status: AC
  Filled 2019-06-06: qty 2

## 2019-06-06 MED ORDER — DEXAMETHASONE SODIUM PHOSPHATE 10 MG/ML IJ SOLN
INTRAMUSCULAR | Status: AC
  Filled 2019-06-06: qty 4

## 2019-06-06 MED ORDER — PROPOFOL 10 MG/ML IV BOLUS
INTRAVENOUS | Status: DC | PRN
  Administered 2019-06-06: 150 mg via INTRAVENOUS

## 2019-06-06 MED ORDER — KETOROLAC TROMETHAMINE 30 MG/ML IJ SOLN
INTRAMUSCULAR | Status: AC
  Filled 2019-06-06: qty 2

## 2019-06-06 MED ORDER — ACETAMINOPHEN 500 MG PO TABS
1000.0000 mg | ORAL_TABLET | ORAL | Status: AC
  Administered 2019-06-06: 1000 mg via ORAL

## 2019-06-06 MED ORDER — OXYCODONE HCL 5 MG PO TABS: 5.0000 mg | ORAL_TABLET | Freq: Once | ORAL | Status: DC | PRN

## 2019-06-06 MED ORDER — FENTANYL CITRATE (PF) 100 MCG/2ML IJ SOLN: 25.0000 ug | INTRAMUSCULAR | Status: DC | PRN

## 2019-06-06 MED ORDER — ONDANSETRON HCL 4 MG/2ML IJ SOLN: 4.0000 mg | Freq: Once | INTRAMUSCULAR | Status: DC | PRN

## 2019-06-06 MED ORDER — MIDAZOLAM HCL 5 MG/5ML IJ SOLN
INTRAMUSCULAR | Status: DC | PRN
  Administered 2019-06-06: 2 mg via INTRAVENOUS

## 2019-06-06 MED ORDER — LIDOCAINE 2% (20 MG/ML) 5 ML SYRINGE
INTRAMUSCULAR | Status: DC | PRN
  Administered 2019-06-06: 60 mg via INTRAVENOUS

## 2019-06-06 MED ORDER — FENTANYL CITRATE (PF) 100 MCG/2ML IJ SOLN
INTRAMUSCULAR | Status: DC | PRN
  Administered 2019-06-06 (×2): 25 ug via INTRAVENOUS

## 2019-06-06 MED ORDER — CEFAZOLIN SODIUM-DEXTROSE 2-4 GM/100ML-% IV SOLN
2.0000 g | INTRAVENOUS | Status: AC
  Administered 2019-06-06: 10:00:00 2 g via INTRAVENOUS

## 2019-06-06 MED ORDER — CEFAZOLIN SODIUM-DEXTROSE 2-4 GM/100ML-% IV SOLN
INTRAVENOUS | Status: AC
  Filled 2019-06-06: qty 100

## 2019-06-06 MED ORDER — FENTANYL CITRATE (PF) 100 MCG/2ML IJ SOLN
INTRAMUSCULAR | Status: AC
  Filled 2019-06-06: qty 2

## 2019-06-06 MED ORDER — HYDROCODONE-ACETAMINOPHEN 5-325 MG PO TABS: 1.0000 | ORAL_TABLET | Freq: Four times a day (QID) | ORAL | 0 refills | Status: DC | PRN

## 2019-06-06 MED ORDER — ONDANSETRON HCL 4 MG/2ML IJ SOLN
INTRAMUSCULAR | Status: DC | PRN
  Administered 2019-06-06: 4 mg via INTRAVENOUS

## 2019-06-06 MED ORDER — LACTATED RINGERS IV SOLN
INTRAVENOUS | Status: DC
  Administered 2019-06-06: 10:00:00 via INTRAVENOUS

## 2019-06-06 MED ORDER — SCOPOLAMINE 1 MG/3DAYS TD PT72: 1.0000 | MEDICATED_PATCH | Freq: Once | TRANSDERMAL | Status: DC

## 2019-06-06 SURGICAL SUPPLY — 47 items
APPLIER CLIP 9.375 MED OPEN (MISCELLANEOUS)
BENZOIN TINCTURE PRP APPL 2/3 (GAUZE/BANDAGES/DRESSINGS) ×2 IMPLANT
BLADE HEX COATED 2.75 (ELECTRODE) ×2 IMPLANT
BLADE SURG 15 STRL LF DISP TIS (BLADE) ×1 IMPLANT
BLADE SURG 15 STRL SS (BLADE) ×1
CANISTER SUC SOCK COL 7IN (MISCELLANEOUS) IMPLANT
CANISTER SUCT 1200ML W/VALVE (MISCELLANEOUS) IMPLANT
CHLORAPREP W/TINT 26 (MISCELLANEOUS) ×2 IMPLANT
CLIP APPLIE 9.375 MED OPEN (MISCELLANEOUS) IMPLANT
COVER BACK TABLE REUSABLE LG (DRAPES) ×2 IMPLANT
COVER MAYO STAND REUSABLE (DRAPES) ×2 IMPLANT
COVER PROBE W GEL 5X96 (DRAPES) ×2 IMPLANT
COVER WAND RF STERILE (DRAPES) IMPLANT
DECANTER SPIKE VIAL GLASS SM (MISCELLANEOUS) IMPLANT
DRAPE LAPAROTOMY 100X72 PEDS (DRAPES) ×2 IMPLANT
DRAPE UTILITY XL STRL (DRAPES) ×2 IMPLANT
DRSG TEGADERM 4X4.75 (GAUZE/BANDAGES/DRESSINGS) ×2 IMPLANT
ELECT REM PT RETURN 9FT ADLT (ELECTROSURGICAL) ×2
ELECTRODE REM PT RTRN 9FT ADLT (ELECTROSURGICAL) ×1 IMPLANT
GAUZE SPONGE 4X4 12PLY STRL LF (GAUZE/BANDAGES/DRESSINGS) IMPLANT
GLOVE BIO SURGEON STRL SZ7 (GLOVE) ×2 IMPLANT
GLOVE BIOGEL PI IND STRL 7.5 (GLOVE) ×1 IMPLANT
GLOVE BIOGEL PI INDICATOR 7.5 (GLOVE) ×1
GOWN STRL REUS W/ TWL LRG LVL3 (GOWN DISPOSABLE) ×2 IMPLANT
GOWN STRL REUS W/TWL LRG LVL3 (GOWN DISPOSABLE) ×2
ILLUMINATOR WAVEGUIDE N/F (MISCELLANEOUS) IMPLANT
KIT MARKER MARGIN INK (KITS) ×2 IMPLANT
LIGHT WAVEGUIDE WIDE FLAT (MISCELLANEOUS) IMPLANT
NEEDLE HYPO 25X1 1.5 SAFETY (NEEDLE) ×2 IMPLANT
NS IRRIG 1000ML POUR BTL (IV SOLUTION) ×2 IMPLANT
PACK BASIN DAY SURGERY FS (CUSTOM PROCEDURE TRAY) ×2 IMPLANT
PENCIL BUTTON HOLSTER BLD 10FT (ELECTRODE) ×2 IMPLANT
SLEEVE SCD COMPRESS KNEE MED (MISCELLANEOUS) ×2 IMPLANT
SPONGE GAUZE 2X2 8PLY STRL LF (GAUZE/BANDAGES/DRESSINGS) ×2 IMPLANT
SPONGE LAP 18X18 RF (DISPOSABLE) IMPLANT
SPONGE LAP 4X18 RFD (DISPOSABLE) ×2 IMPLANT
STRIP CLOSURE SKIN 1/2X4 (GAUZE/BANDAGES/DRESSINGS) ×2 IMPLANT
SUT MON AB 4-0 PC3 18 (SUTURE) ×2 IMPLANT
SUT SILK 2 0 SH (SUTURE) IMPLANT
SUT VIC AB 3-0 SH 27 (SUTURE) ×1
SUT VIC AB 3-0 SH 27X BRD (SUTURE) ×1 IMPLANT
SYR BULB 3OZ (MISCELLANEOUS) IMPLANT
SYR CONTROL 10ML LL (SYRINGE) ×2 IMPLANT
TOWEL GREEN STERILE FF (TOWEL DISPOSABLE) ×2 IMPLANT
TRAY FAXITRON CT DISP (TRAY / TRAY PROCEDURE) ×2 IMPLANT
TUBE CONNECTING 20X1/4 (TUBING) IMPLANT
YANKAUER SUCT BULB TIP NO VENT (SUCTIONS) IMPLANT

## 2019-06-06 NOTE — Anesthesia Postprocedure Evaluation (Signed)
Anesthesia Post Note  Patient: Heather Summers  Procedure(s) Performed: LEFT BREAST LUMPECTOMY WITH RADIOACTIVE SEED LOCALIZATION (Left Breast)     Patient location during evaluation: PACU Anesthesia Type: General Level of consciousness: awake and alert Pain management: pain level controlled Vital Signs Assessment: post-procedure vital signs reviewed and stable Respiratory status: spontaneous breathing, nonlabored ventilation and respiratory function stable Cardiovascular status: blood pressure returned to baseline and stable Postop Assessment: no apparent nausea or vomiting Anesthetic complications: no    Last Vitals:  Vitals:   06/06/19 1130 06/06/19 1204  BP: 120/73 (!) 142/78  Pulse: 68 63  Resp: 10 20  Temp:  36.9 C  SpO2: 99% 99%    Last Pain:  Vitals:   06/06/19 1204  TempSrc: Oral  PainSc: 0-No pain   Pain Goal:                   Lidia Collum

## 2019-06-06 NOTE — Op Note (Signed)
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 Anesthesia:  GEN - LMA Indications:  This is a 68 year old female in good health who presents after routine screening mammogram. In the superior anterior left breast there was a new mass that was further evaluated with compression views and ultrasound. This showed a 6 x 3 x 5 mm well circumscribed hypoechoic mass in the left breast 12:00 1 cm from the nipple. She underwent ultrasound-guided biopsy on 04/19/19. 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 The patient is now referred to [place] to discuss excision.  A radioactive seed was placed yesterday by Radiology.  Description of procedure: The patient is brought to the operating room placed in supine position on the operating room table. After an adequate level of general anesthesia was obtained, her left breast was prepped with ChloraPrep and draped in sterile fashion. A timeout was taken to ensure the proper patient and proper procedure. We interrogated the breast with the neoprobe. We made a circumareolar incision around the upper side of the nipple after infiltrating with 0.25% Marcaine. Dissection was carried down in the breast tissue with cautery. We used the neoprobe to guide [place] towards the radioactive seed. We excised an area of tissue around the radioactive seed 2 cm in diameter. The specimen was removed and was oriented with a paint kit. Specimen mammogram showed the radioactive seed as well as the biopsy clip within the specimen. This was sent for pathologic examination. There is no residual radioactivity within the biopsy cavity. We inspected carefully for hemostasis. The wound was thoroughly irrigated. The wound was closed with a deep layer of 3-0 Vicryl and a subcuticular layer of 4-0 Monocryl. Benzoin Steri-Strips  were applied. The patient was then extubated and brought to the recovery room in stable condition. All sponge, instrument, and needle counts are correct.  Imogene Burn. Georgette Dover, MD, [facility]  General/ Trauma Surgery  06/06/2019 10:57 AM

## 2019-06-06 NOTE — Anesthesia Procedure Notes (Signed)
Procedure Name: LMA Insertion Date/Time: 06/06/2019 10:23 AM Performed by: Gwyndolyn Saxon, CRNA Pre-anesthesia Checklist: Patient identified, Emergency Drugs available, Suction available and Patient being monitored Patient Re-evaluated:Patient Re-evaluated prior to induction Oxygen Delivery Method: Circle system utilized Preoxygenation: Pre-oxygenation with 100% oxygen Induction Type: IV induction Ventilation: Mask ventilation without difficulty LMA: LMA inserted LMA Size: 3.0 Number of attempts: 1 Placement Confirmation: positive ETCO2 and breath sounds checked- equal and bilateral Tube secured with: Tape Dental Injury: Teeth and Oropharynx as per pre-operative assessment

## 2019-06-06 NOTE — Anesthesia Preprocedure Evaluation (Signed)
Anesthesia Evaluation  Patient identified by MRN, date of birth, ID band Patient awake    Reviewed: Allergy & Precautions, NPO status , Patient's Chart, lab work & pertinent test results  History of Anesthesia Complications Negative for: history of anesthetic complications  Airway Mallampati: II  TM Distance: >3 FB Neck ROM: Full    Dental  (+) Teeth Intact   Pulmonary neg pulmonary ROS,    Pulmonary exam normal        Cardiovascular hypertension, Pt. on medications Normal cardiovascular exam     Neuro/Psych Anxiety negative neurological ROS     GI/Hepatic negative GI ROS, Neg liver ROS,   Endo/Other  negative endocrine ROS  Renal/GU negative Renal ROS  negative genitourinary   Musculoskeletal negative musculoskeletal ROS (+)   Abdominal   Peds  Hematology negative hematology ROS (+)   Anesthesia Other Findings Breast cancer  Reproductive/Obstetrics                            Anesthesia Physical Anesthesia Plan  ASA: II  Anesthesia Plan: General   Post-op Pain Management:    Induction: Intravenous  PONV Risk Score and Plan: 3 and Ondansetron, Dexamethasone and Treatment may vary due to age or medical condition  Airway Management Planned: LMA  Additional Equipment: None  Intra-op Plan:   Post-operative Plan: Extubation in OR  Informed Consent: I have reviewed the patients History and Physical, chart, labs and discussed the procedure including the risks, benefits and alternatives for the proposed anesthesia with the patient or authorized representative who has indicated his/her understanding and acceptance.     Dental advisory given  Plan Discussed with:   Anesthesia Plan Comments:         Anesthesia Quick Evaluation

## 2019-06-06 NOTE — Discharge Instructions (Signed)
[facility] °Office Phone Number [phone] ° °BREAST BIOPSY/ PARTIAL MASTECTOMY: POST OP INSTRUCTIONS ° °Always review your discharge instruction sheet given to you by the facility where your surgery was performed. ° °IF YOU HAVE DISABILITY OR FAMILY LEAVE FORMS, YOU MUST BRING THEM TO THE OFFICE FOR PROCESSING.  DO NOT GIVE THEM TO YOUR DOCTOR. ° °1. A prescription for pain medication may be given to you upon discharge.  Take your pain medication as prescribed, if needed.  If narcotic pain medicine is not needed, then you may take acetaminophen (Tylenol) or ibuprofen (Advil) as needed. °2. Take your usually prescribed medications unless otherwise directed °3. If you need a refill on your pain medication, please contact your pharmacy.  They will contact our office to request authorization.  Prescriptions will not be filled after 5pm or on week-ends. °4. You should eat very light the first 24 hours after surgery, such as soup, crackers, pudding, etc.  Resume your normal diet the day after surgery. °5. Most patients will experience some swelling and bruising in the breast.  Ice packs and a good support bra will help.  Swelling and bruising can take several days to resolve.  °6. It is common to experience some constipation if taking pain medication after surgery.  Increasing fluid intake and taking a stool softener will usually help or prevent this problem from occurring.  A mild laxative (Milk of Magnesia or Miralax) should be taken according to package directions if there are no bowel movements after 48 hours. °7. Unless discharge instructions indicate otherwise, you may remove your bandages 24-48 hours after surgery, and you may shower at that time.  You may have steri-strips (small skin tapes) in place directly over the incision.  These strips should be left on the skin for 7-10 days.  If your surgeon used skin glue on the incision, you may shower in 24 hours.  The glue will flake off over the  next 2-3 weeks.  Any sutures or staples will be removed at the office during your follow-up visit. °8. ACTIVITIES:  You may resume regular daily activities (gradually increasing) beginning the next day.  Wearing a good support bra or sports bra minimizes pain and swelling.  You may have sexual intercourse when it is comfortable. °a. You may drive when you no longer are taking prescription pain medication, you can comfortably wear a seatbelt, and you can safely maneuver your car and apply brakes. °b. RETURN TO WORK:  ______________________________________________________________________________________ °9. You should see your doctor in the office for a follow-up appointment approximately two weeks after your surgery.  Your doctor’s nurse will typically make your follow-up appointment when she calls you with your pathology report.  Expect your pathology report 2-3 business days after your surgery.  You may call to check if you do not hear from us after three days. °10. OTHER INSTRUCTIONS: _______________________________________________________________________________________________ _____________________________________________________________________________________________________________________________________ °_____________________________________________________________________________________________________________________________________ °_____________________________________________________________________________________________________________________________________ ° °WHEN TO CALL YOUR DOCTOR: °1. Fever over 101.0 °2. Nausea and/or vomiting. °3. Extreme swelling or bruising. °4. Continued bleeding from incision. °5. Increased pain, redness, or drainage from the incision. ° °The clinic staff is available to answer your questions during regular business hours.  Please don’t hesitate to call and ask to speak to one of the nurses for clinical concerns.  If you have a medical emergency, go to the nearest  emergency room or call 911.  A surgeon from [facility] is always on call at the hospital. ° °For further questions, please visit [email or web address]  ° ° °  May take tylenol at 3p today if needed  May take ibuprofen at [place] Instructions  Activity: Get plenty of rest for the remainder of the day. A responsible individual must stay with you for 24 hours following the procedure.  For the next 24 hours, DO NOT: -Drive a car -Paediatric nurse -Drink alcoholic beverages -Take any medication unless instructed by your physician -Make any legal decisions or sign important papers.  Meals: Start with liquid foods such as gelatin or soup. Progress to regular foods as tolerated. Avoid greasy, spicy, heavy foods. If nausea and/or vomiting occur, drink only clear liquids until the nausea and/or vomiting subsides. Call your physician if vomiting continues.  Special Instructions/Symptoms: Your throat may feel dry or sore from the anesthesia or the breathing tube placed in your throat during surgery. If this causes discomfort, gargle with warm salt water. The discomfort should disappear within 24 hours.  If you had a scopolamine patch placed behind your ear for the management of post- operative nausea and/or vomiting:  1. The medication in the patch is effective for 72 hours, after which it should be removed.  Wrap patch in a tissue and discard in the trash. Wash hands thoroughly with soap and water. 2. You may remove the patch earlier than 72 hours if you experience unpleasant side effects which may include dry mouth, dizziness or visual disturbances. 3. Avoid touching the patch. Wash your hands with soap and water after contact with the patch.        today if needed

## 2019-06-06 NOTE — H&P (Signed)
History of Present Illness  The patient is a 68 year old female who presents with a breast mass. Referred by Dr. Vanessa Kick for left breast mass PCP - [name]  This is a 68 year old female in good health who presents after routine screening mammogram. In the superior anterior left breast there was a new mass that was further evaluated with compression views and ultrasound. This showed a 6 x 3 x 5 mm well circumscribed hypoechoic mass in the left breast 12:00 1 cm from the nipple. She underwent ultrasound-guided biopsy on 04/19/19. 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 The patient is now referred to [place] to discuss excision.  CLINICAL DATA: Patient recalled from screening for left breast mass. EXAM: DIGITAL DIAGNOSTIC LEFT MAMMOGRAM WITH CAD AND TOMO ULTRASOUND LEFT BREAST COMPARISON: Previous exam(s). ACR Breast Density Category c: The breast tissue is heterogeneously dense, which may obscure small masses. FINDINGS: Within the superior anterior left breast slightly medially there is a new lobular mass, further evaluated with spot compression CC and MLO tomosynthesis images. Mammographic images were processed with CAD. Targeted ultrasound is performed, showing a 6 x 3 x 5 mm oval circumscribed hypoechoic mass left breast 12 o'clock position 1 cm from the nipple. No left axillary adenopathy. IMPRESSION: Indeterminate left breast mass 12 o'clock position. RECOMMENDATION: Ultrasound-guided core needle biopsy indeterminate left breast mass. I have discussed the findings and recommendations with the patient. Results were also provided in writing at the conclusion of the visit. If applicable, a reminder letter will be sent to the patient regarding the next appointment. BI-RADS CATEGORY 4: Suspicious. Electronically Signed By: Lovey Newcomer M.D. On: 04/18/2019 10:02   CLINICAL DATA: 68 year old female for tissue sampling of 0.6  cm UPPER LEFT breast mass.  EXAM: ULTRASOUND GUIDED LEFT BREAST CORE NEEDLE BIOPSY  COMPARISON: Previous exam(s).  FINDINGS: I met with the patient and we discussed the procedure of ultrasound-guided biopsy, including benefits and alternatives. We discussed the high likelihood of a successful procedure. We discussed the risks of the procedure, including infection, bleeding, tissue injury, clip migration, and inadequate sampling. Informed written consent was given. The usual time-out protocol was performed immediately prior to the procedure.  Using sterile technique and 1% Lidocaine as local anesthetic, under direct ultrasound visualization, a 12 gauge spring-loaded device was used to perform biopsy of 0.6 cm hypoechoic mass at the 12 o'clock position of the LEFT breast 1 cm from the nipple using a MEDIAL approach. At the conclusion of the procedure a RIBBON tissue marker clip was deployed into the biopsy cavity. Follow up 2 view mammogram was performed and dictated separately.  IMPRESSION: Ultrasound guided biopsy of the 0.6 cm UPPER LEFT breast mass. No apparent complications.  Electronically Signed: By: Margarette Canada M.D. On: 04/19/2019 16:40  CLINICAL DATA: Evaluate RIBBON clip placement following ultrasound-guided LEFT breast biopsy.  EXAM: DIAGNOSTIC LEFT MAMMOGRAM POST ULTRASOUND BIOPSY  COMPARISON: Previous exam(s).  FINDINGS: Mammographic images were obtained following ultrasound guided biopsy of the 0.6 cm mass within the UPPER LEFT breast.  The RIBBON clip is in satisfactory position adjacent to the mass.  IMPRESSION: Satisfactory RIBBON clip position following ultrasound-guided LEFT breast biopsy.  Final Assessment: Post Procedure Mammograms for Marker Placement   Electronically Signed By: Margarette Canada M.D. On: 04/19/2019 16:41      Problem List/Past Medical  MASS OF LEFT BREAST ON MAMMOGRAM (N63.20)  Past Surgical  History  Colon Polyp Removal - Colonoscopy  Diagnostic Studies History  Colonoscopy within last year Mammogram  within last year  Allergies  No Known Drug Allergies Allergies Reconciled  Medication History  Alpha Lipoic Acid (200MG Capsule, Oral) Active. Ashwagandha (500MG Capsule, Oral) Active. Biotin (10MG Tablet, Oral) Active. Co Q-10 (100MG Capsule, Oral) Active. Lisinopril (10MG Tablet, Oral) Active. Magnesium (200MG Tablet, Oral) Active. Multivitamin (Oral) Active. Vitamin C (500MG Capsule, Oral) Active. Vitamin D (Cholecalciferol) (25 MCG(1000 UT) Capsule, Oral) Active. Medications Reconciled  Social History Alcohol use Occasional alcohol use. Caffeine use Coffee, Tea. No drug use Tobacco use Never smoker.  Family History  Cancer Brother. Cerebrovascular Accident Father, Mother. Migraine Headache Daughter, Father.  Pregnancy / Birth History Age at menarche 48 years. Age of menopause 52-55 Gravida 2 Length (months) of breastfeeding 12-24 Maternal age 33-20 Para 2  Other Problems  Arthritis Heart murmur Hemorrhoids High blood pressure Migraine Headache     Review of Systems General Not Present- Appetite Loss, Chills, Fatigue, Fever, Night Sweats, Weight Gain and Weight Loss. Skin Not Present- Change in Wart/Mole, Dryness, Hives, Jaundice, New Lesions, Non-Healing Wounds, Rash and Ulcer. HEENT Present- Wears glasses/contact lenses. Not Present- Earache, Hearing Loss, Hoarseness, Nose Bleed, Oral Ulcers, Ringing in the Ears, Seasonal Allergies, Sinus Pain, Sore Throat, Visual Disturbances and Yellow Eyes. Respiratory Not Present- Bloody sputum, Chronic Cough, Difficulty Breathing, Snoring and Wheezing. Breast Not Present- Breast Mass, Breast Pain, Nipple Discharge and Skin Changes. Cardiovascular Present- Leg Cramps. Not Present- Chest Pain, Difficulty Breathing Lying Down, Palpitations, Rapid Heart Rate, Shortness  of Breath and Swelling of Extremities. Gastrointestinal Present- Hemorrhoids. Not Present- Abdominal Pain, Bloating, Bloody Stool, Change in Bowel Habits, Chronic diarrhea, Constipation, Difficulty Swallowing, Excessive gas, Gets full quickly at meals, Indigestion, Nausea, Rectal Pain and Vomiting. Female Genitourinary Not Present- Frequency, Nocturia, Painful Urination, Pelvic Pain and Urgency. Musculoskeletal Present- Joint Pain. Not Present- Back Pain, Joint Stiffness, Muscle Pain, Muscle Weakness and Swelling of Extremities. Neurological Present- Tingling. Not Present- Decreased Memory, Fainting, Headaches, Numbness, Seizures, Tremor, Trouble walking and Weakness. Psychiatric Present- Anxiety. Not Present- Bipolar, Change in Sleep Pattern, Depression, Fearful and Frequent crying. Endocrine Present- Hot flashes. Not Present- Cold Intolerance, Excessive Hunger, Hair Changes, Heat Intolerance and New Diabetes. Hematology Not Present- Blood Thinners, Easy Bruising, Excessive bleeding, Gland problems, HIV and Persistent Infections.  Vitals  Weight: 144.2 lb Height: 65in Body Surface Area: 1.72 m Body Mass Index: 24 kg/m  Temp.: 97.39F  Pulse: 78 (Regular)  BP: 124/84 (Sitting, Left Arm, Standard)        Physical Exam   The physical exam findings are as follows: Note:WDWN in NAD Eyes: Pupils equal, round; sclera anicteric HENT: Oral mucosa moist; good dentition Neck: No masses palpated, no thyromegaly Lungs: CTA bilaterally; normal respiratory effort Breasts: symmetric; no nipple retraction or discharge; no axillary lymphadenopathy; no palpable masses in either breast; healing biopsy site CV: Regular rate and rhythm; no murmurs; extremities well-perfused with no edema Abd: +bowel sounds, soft, non-tender, no palpable organomegaly; no palpable hernias Skin: Warm, dry; no sign of jaundice Psychiatric - alert and oriented x 4; calm mood and  affect    Assessment & Plan   MASS OF LEFT BREAST ON MAMMOGRAM (N63.20) Impression: Complex sclerosing lesion 12:00 1 cmfn  Current Plans Schedule for Surgery - left radioactive seed localized lumpectomy. The surgical procedure has been discussed with the patient. Potential risks, benefits, alternative treatments, and expected outcomes have been explained. All of the patient's questions at this time have been answered. The likelihood of reaching the patient's treatment goal is good. The patient understand the proposed  surgical procedure and wishes to proceed.  ATYPICAL DUCTAL HYPERPLASIA OF LEFT BREAST (N60.92)   Imogene Burn. Georgette Dover, MD, [facility]  General/ Trauma Surgery Beeper [phone]  06/06/2019 8:15 AM

## 2019-06-06 NOTE — Transfer of Care (Signed)
Immediate Anesthesia Transfer of Care Note  Patient: Heather Summers  Procedure(s) Performed: LEFT BREAST LUMPECTOMY WITH RADIOACTIVE SEED LOCALIZATION (Left Breast)  Patient Location: PACU  Anesthesia Type:General  Level of Consciousness: drowsy  Airway & Oxygen Therapy: Patient Spontanous Breathing and Patient connected to face mask oxygen  Post-op Assessment: Report given to RN and Post -op Vital signs reviewed and stable  Post vital signs: Reviewed and stable  Last Vitals:  Vitals Value Taken Time  BP 105/59 06/06/19 1102  Temp 36.8 C 06/06/19 1103  Pulse 73 06/06/19 1108  Resp 11 06/06/19 1108  SpO2 100 % 06/06/19 1108  Vitals shown include unvalidated device data.  Last Pain:  Vitals:   06/06/19 1103  TempSrc:   PainSc: 0-No pain         Complications: No apparent anesthesia complications

## 2019-06-08 ENCOUNTER — Encounter (HOSPITAL_BASED_OUTPATIENT_CLINIC_OR_DEPARTMENT_OTHER): Payer: Self-pay | Admitting: Surgery

## 2019-06-15 DIAGNOSIS — Z1389 Encounter for screening for other disorder: Secondary | ICD-10-CM | POA: Diagnosis not present

## 2019-06-15 DIAGNOSIS — M8588 Other specified disorders of bone density and structure, other site: Secondary | ICD-10-CM | POA: Diagnosis not present

## 2019-06-15 DIAGNOSIS — Z0000 Encounter for general adult medical examination without abnormal findings: Secondary | ICD-10-CM | POA: Diagnosis not present

## 2019-06-15 DIAGNOSIS — I10 Essential (primary) hypertension: Secondary | ICD-10-CM | POA: Diagnosis not present

## 2019-06-15 DIAGNOSIS — Z131 Encounter for screening for diabetes mellitus: Secondary | ICD-10-CM | POA: Diagnosis not present

## 2019-06-15 DIAGNOSIS — M25551 Pain in right hip: Secondary | ICD-10-CM | POA: Diagnosis not present

## 2019-06-15 DIAGNOSIS — G629 Polyneuropathy, unspecified: Secondary | ICD-10-CM | POA: Diagnosis not present

## 2019-07-16 DIAGNOSIS — Z23 Encounter for immunization: Secondary | ICD-10-CM | POA: Diagnosis not present

## 2019-12-11 DIAGNOSIS — R39198 Other difficulties with micturition: Secondary | ICD-10-CM | POA: Diagnosis not present

## 2020-01-08 DIAGNOSIS — D225 Melanocytic nevi of trunk: Secondary | ICD-10-CM | POA: Diagnosis not present

## 2020-01-08 DIAGNOSIS — L918 Other hypertrophic disorders of the skin: Secondary | ICD-10-CM | POA: Diagnosis not present

## 2020-02-20 DIAGNOSIS — D1801 Hemangioma of skin and subcutaneous tissue: Secondary | ICD-10-CM | POA: Diagnosis not present

## 2020-02-20 DIAGNOSIS — D2262 Melanocytic nevi of left upper limb, including shoulder: Secondary | ICD-10-CM | POA: Diagnosis not present

## 2020-02-20 DIAGNOSIS — L821 Other seborrheic keratosis: Secondary | ICD-10-CM | POA: Diagnosis not present

## 2020-02-20 DIAGNOSIS — D2261 Melanocytic nevi of right upper limb, including shoulder: Secondary | ICD-10-CM | POA: Diagnosis not present

## 2020-02-20 DIAGNOSIS — L309 Dermatitis, unspecified: Secondary | ICD-10-CM | POA: Diagnosis not present

## 2020-02-20 DIAGNOSIS — L503 Dermatographic urticaria: Secondary | ICD-10-CM | POA: Diagnosis not present

## 2020-02-20 DIAGNOSIS — D2272 Melanocytic nevi of left lower limb, including hip: Secondary | ICD-10-CM | POA: Diagnosis not present

## 2020-02-20 DIAGNOSIS — D2271 Melanocytic nevi of right lower limb, including hip: Secondary | ICD-10-CM | POA: Diagnosis not present

## 2020-02-20 DIAGNOSIS — L820 Inflamed seborrheic keratosis: Secondary | ICD-10-CM | POA: Diagnosis not present

## 2020-02-20 DIAGNOSIS — D225 Melanocytic nevi of trunk: Secondary | ICD-10-CM | POA: Diagnosis not present

## 2020-06-10 DIAGNOSIS — I10 Essential (primary) hypertension: Secondary | ICD-10-CM | POA: Diagnosis not present

## 2020-06-10 DIAGNOSIS — Z131 Encounter for screening for diabetes mellitus: Secondary | ICD-10-CM | POA: Diagnosis not present

## 2020-06-10 DIAGNOSIS — Z1389 Encounter for screening for other disorder: Secondary | ICD-10-CM | POA: Diagnosis not present

## 2020-06-10 DIAGNOSIS — Z0000 Encounter for general adult medical examination without abnormal findings: Secondary | ICD-10-CM | POA: Diagnosis not present

## 2020-06-10 DIAGNOSIS — M8588 Other specified disorders of bone density and structure, other site: Secondary | ICD-10-CM | POA: Diagnosis not present

## 2020-06-10 DIAGNOSIS — M25551 Pain in right hip: Secondary | ICD-10-CM | POA: Diagnosis not present

## 2020-06-10 DIAGNOSIS — G629 Polyneuropathy, unspecified: Secondary | ICD-10-CM | POA: Diagnosis not present

## 2020-06-17 DIAGNOSIS — R87810 Cervical high risk human papillomavirus (HPV) DNA test positive: Secondary | ICD-10-CM | POA: Diagnosis not present

## 2020-06-17 DIAGNOSIS — M8588 Other specified disorders of bone density and structure, other site: Secondary | ICD-10-CM | POA: Diagnosis not present

## 2020-06-17 DIAGNOSIS — G629 Polyneuropathy, unspecified: Secondary | ICD-10-CM | POA: Diagnosis not present

## 2020-06-17 DIAGNOSIS — Z86010 Personal history of colonic polyps: Secondary | ICD-10-CM | POA: Diagnosis not present

## 2020-06-17 DIAGNOSIS — Z0000 Encounter for general adult medical examination without abnormal findings: Secondary | ICD-10-CM | POA: Diagnosis not present

## 2020-06-17 DIAGNOSIS — N870 Mild cervical dysplasia: Secondary | ICD-10-CM | POA: Diagnosis not present

## 2020-06-17 DIAGNOSIS — Z1389 Encounter for screening for other disorder: Secondary | ICD-10-CM | POA: Diagnosis not present

## 2020-06-17 DIAGNOSIS — I10 Essential (primary) hypertension: Secondary | ICD-10-CM | POA: Diagnosis not present

## 2020-06-17 DIAGNOSIS — M25551 Pain in right hip: Secondary | ICD-10-CM | POA: Diagnosis not present

## 2020-07-12 DIAGNOSIS — Z23 Encounter for immunization: Secondary | ICD-10-CM | POA: Diagnosis not present

## 2020-07-28 DIAGNOSIS — Z23 Encounter for immunization: Secondary | ICD-10-CM | POA: Diagnosis not present

## 2020-11-03 IMAGING — MG DIGITAL DIAGNOSTIC UNILATERAL LEFT MAMMOGRAM WITH TOMO AND CAD
6 series · 6 of 18 positions shown · non-contrast
Comparison: Previous exam(s).

CLINICAL DATA: Patient recalled from screening for left breast
mass.

EXAM:
DIGITAL DIAGNOSTIC LEFT MAMMOGRAM WITH CAD AND TOMO
ULTRASOUND LEFT BREAST

[L CC synth-2D]
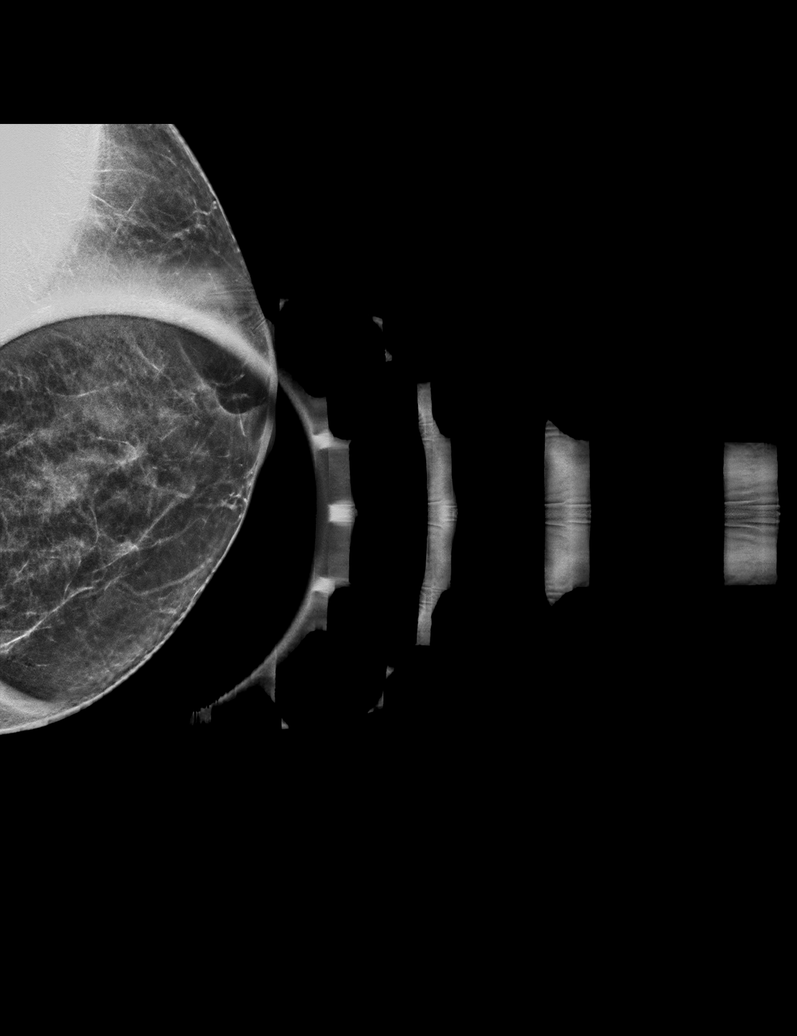

[L MLO synth-2D (1 of 2)]
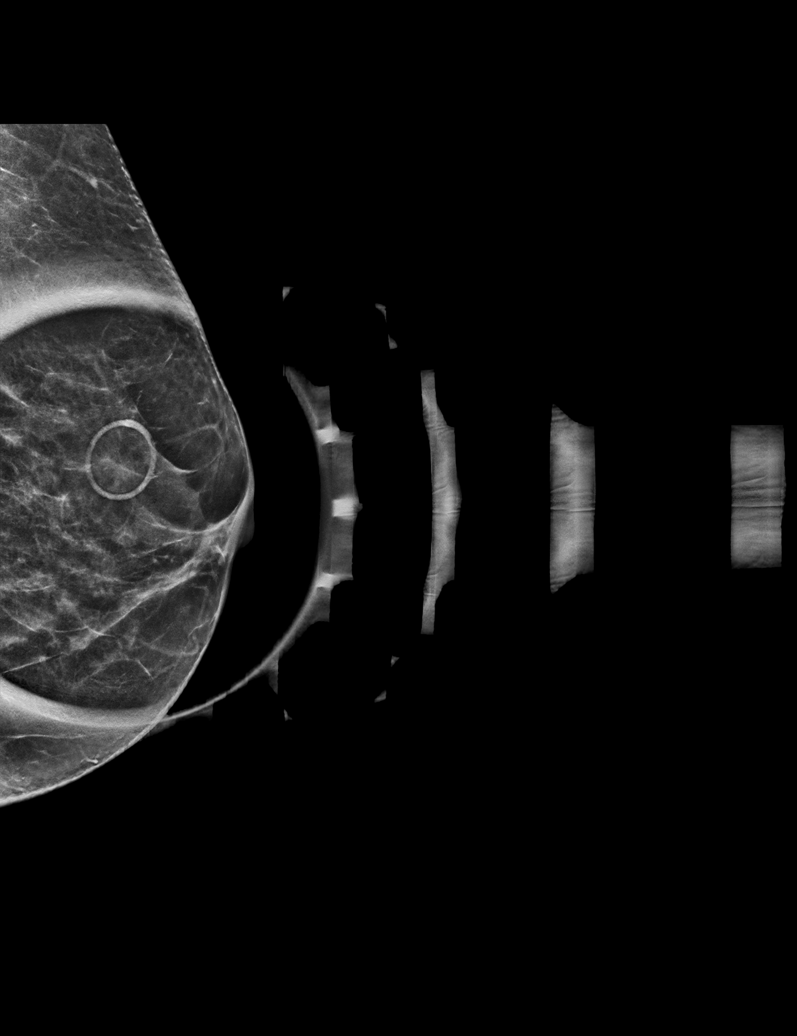

[L MLO synth-2D (2 of 2)]
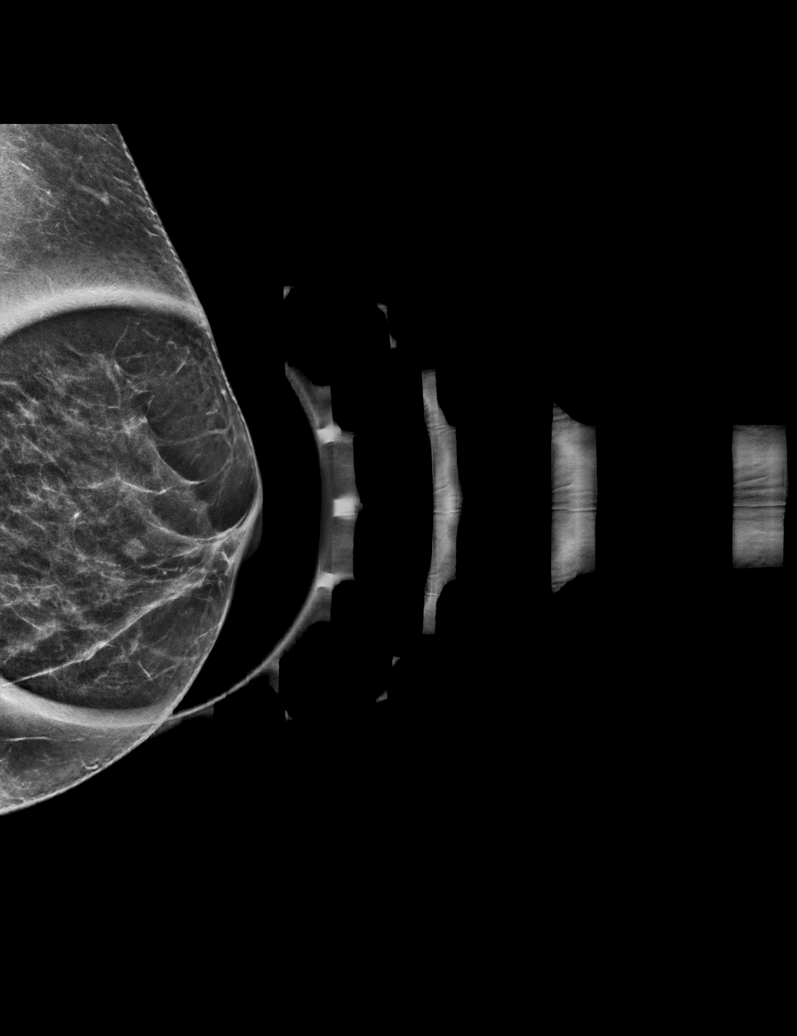

[L CC tomo · tomo slice 24/47.0]
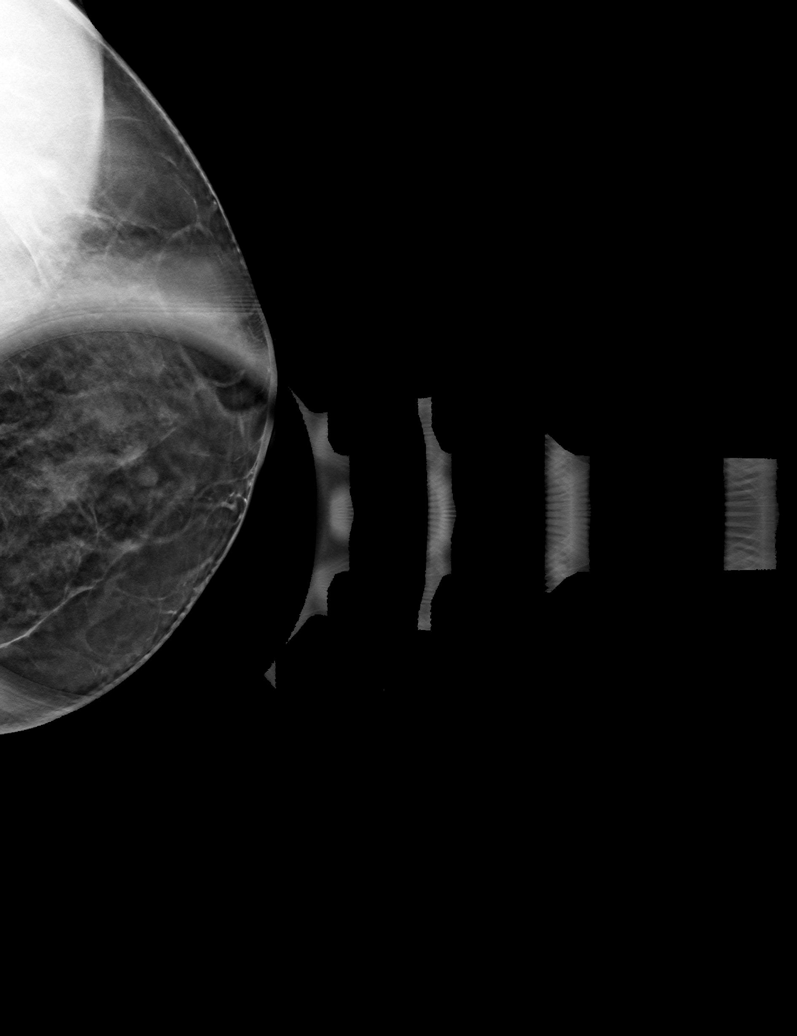

[L MLO tomo (1 of 2) · tomo slice 27/52.0]
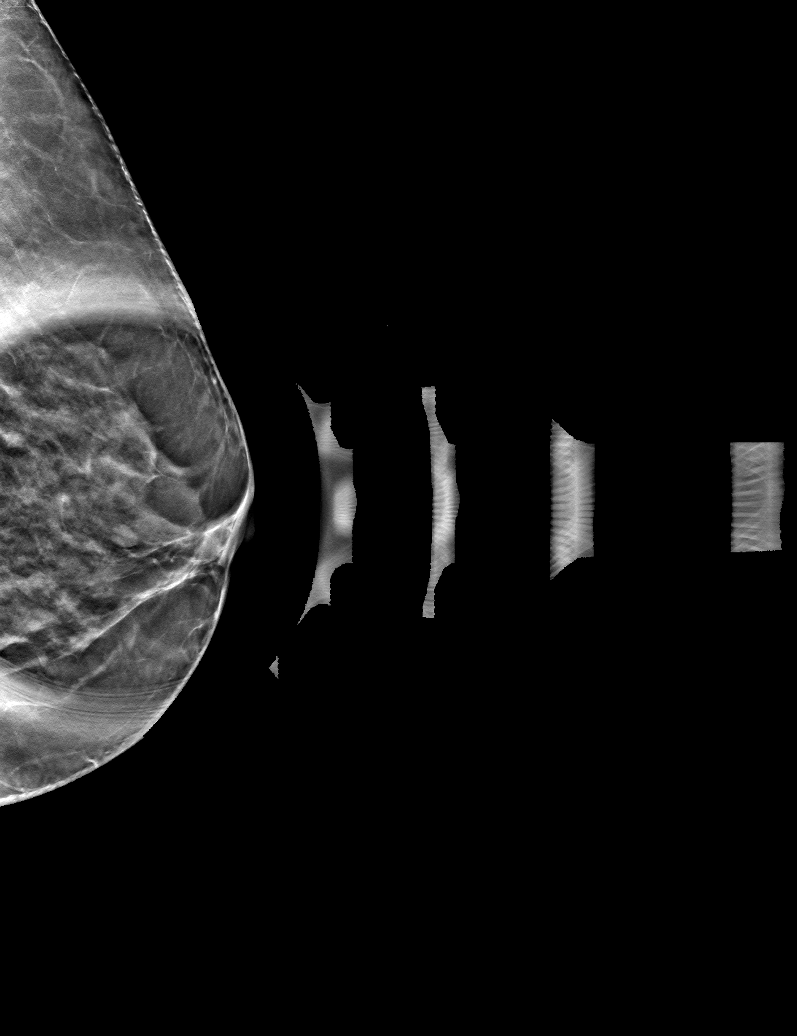

[L MLO tomo (2 of 2) · tomo slice 27/53.0]
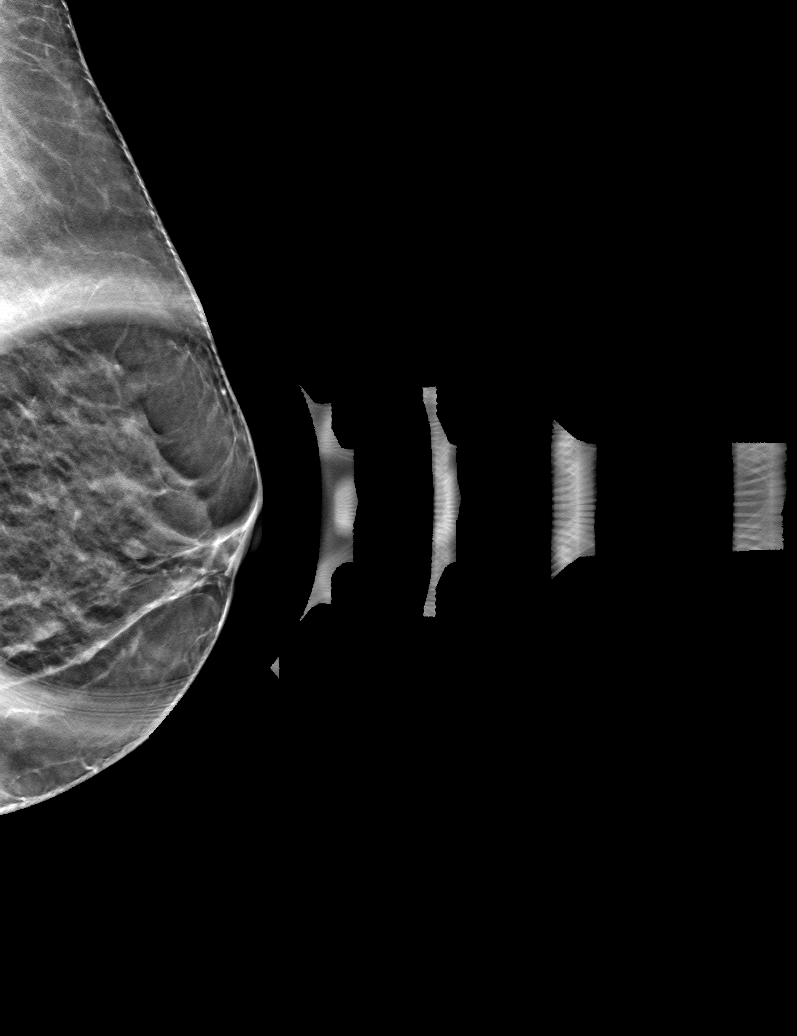

[6 of 18 positions shown; findings below may reference images not displayed]

ACR Breast Density Category c: The breast tissue is heterogeneously
dense, which may obscure small masses.
FINDINGS: Within the superior anterior left breast slightly medially there is
a new lobular mass, further evaluated with spot compression CC and
MLO tomosynthesis images.

Mammographic images were processed with CAD.

Targeted ultrasound is performed, showing a 6 x 3 x 5 mm oval
circumscribed hypoechoic mass left breast 12 o'clock position 1 cm
from the nipple.

No left axillary adenopathy.
IMPRESSION: Indeterminate left breast mass 12 o'clock position.

RECOMMENDATION:
Ultrasound-guided core needle biopsy indeterminate left breast mass.

I have discussed the findings and recommendations with the patient.
Results were also provided in writing at the conclusion of the
visit. If applicable, a reminder letter will be sent to the patient
regarding the next appointment.

BI-RADS CATEGORY  4: Suspicious.

## 2021-03-05 DIAGNOSIS — Z23 Encounter for immunization: Secondary | ICD-10-CM | POA: Diagnosis not present

## 2021-04-22 DIAGNOSIS — Z1231 Encounter for screening mammogram for malignant neoplasm of breast: Secondary | ICD-10-CM | POA: Diagnosis not present

## 2021-04-22 DIAGNOSIS — Z01419 Encounter for gynecological examination (general) (routine) without abnormal findings: Secondary | ICD-10-CM | POA: Diagnosis not present

## 2021-04-22 DIAGNOSIS — R87810 Cervical high risk human papillomavirus (HPV) DNA test positive: Secondary | ICD-10-CM | POA: Diagnosis not present

## 2021-04-22 DIAGNOSIS — Z124 Encounter for screening for malignant neoplasm of cervix: Secondary | ICD-10-CM | POA: Diagnosis not present

## 2021-06-01 DIAGNOSIS — U071 COVID-19: Secondary | ICD-10-CM | POA: Diagnosis not present

## 2021-06-17 DIAGNOSIS — G629 Polyneuropathy, unspecified: Secondary | ICD-10-CM | POA: Diagnosis not present

## 2021-06-17 DIAGNOSIS — Z131 Encounter for screening for diabetes mellitus: Secondary | ICD-10-CM | POA: Diagnosis not present

## 2021-06-17 DIAGNOSIS — Z1389 Encounter for screening for other disorder: Secondary | ICD-10-CM | POA: Diagnosis not present

## 2021-06-17 DIAGNOSIS — M25551 Pain in right hip: Secondary | ICD-10-CM | POA: Diagnosis not present

## 2021-06-17 DIAGNOSIS — Z136 Encounter for screening for cardiovascular disorders: Secondary | ICD-10-CM | POA: Diagnosis not present

## 2021-06-17 DIAGNOSIS — Z0000 Encounter for general adult medical examination without abnormal findings: Secondary | ICD-10-CM | POA: Diagnosis not present

## 2021-06-17 DIAGNOSIS — Z13220 Encounter for screening for lipoid disorders: Secondary | ICD-10-CM | POA: Diagnosis not present

## 2021-06-17 DIAGNOSIS — Z86010 Personal history of colonic polyps: Secondary | ICD-10-CM | POA: Diagnosis not present

## 2021-06-17 DIAGNOSIS — M8588 Other specified disorders of bone density and structure, other site: Secondary | ICD-10-CM | POA: Diagnosis not present

## 2021-06-17 DIAGNOSIS — I10 Essential (primary) hypertension: Secondary | ICD-10-CM | POA: Diagnosis not present

## 2021-06-22 DIAGNOSIS — M8588 Other specified disorders of bone density and structure, other site: Secondary | ICD-10-CM | POA: Diagnosis not present

## 2021-06-22 DIAGNOSIS — I10 Essential (primary) hypertension: Secondary | ICD-10-CM | POA: Diagnosis not present

## 2021-06-22 DIAGNOSIS — Z0000 Encounter for general adult medical examination without abnormal findings: Secondary | ICD-10-CM | POA: Diagnosis not present

## 2021-06-22 DIAGNOSIS — E785 Hyperlipidemia, unspecified: Secondary | ICD-10-CM | POA: Diagnosis not present

## 2021-06-22 DIAGNOSIS — Z1389 Encounter for screening for other disorder: Secondary | ICD-10-CM | POA: Diagnosis not present

## 2021-07-03 DIAGNOSIS — Z23 Encounter for immunization: Secondary | ICD-10-CM | POA: Diagnosis not present

## 2021-07-03 DIAGNOSIS — R300 Dysuria: Secondary | ICD-10-CM | POA: Diagnosis not present

## 2021-07-03 DIAGNOSIS — I10 Essential (primary) hypertension: Secondary | ICD-10-CM | POA: Diagnosis not present

## 2021-07-23 ENCOUNTER — Emergency Department (HOSPITAL_COMMUNITY)
Admission: EM | Admit: 2021-07-23 | Discharge: 2021-07-23 | Disposition: A | Discharge status: Home / Self Care | Payer: Medicare Other | Attending: Emergency Medicine | Admitting: Emergency Medicine

## 2021-07-23 ENCOUNTER — Other Ambulatory Visit: Payer: Self-pay | Admitting: Family Medicine

## 2021-07-23 ENCOUNTER — Other Ambulatory Visit: Payer: Self-pay

## 2021-07-23 ENCOUNTER — Encounter (HOSPITAL_COMMUNITY): Payer: Self-pay | Admitting: Emergency Medicine

## 2021-07-23 DIAGNOSIS — Z79899 Other long term (current) drug therapy: Secondary | ICD-10-CM | POA: Diagnosis not present

## 2021-07-23 DIAGNOSIS — E785 Hyperlipidemia, unspecified: Secondary | ICD-10-CM | POA: Diagnosis not present

## 2021-07-23 DIAGNOSIS — Z7982 Long term (current) use of aspirin: Secondary | ICD-10-CM | POA: Diagnosis not present

## 2021-07-23 DIAGNOSIS — I10 Essential (primary) hypertension: Secondary | ICD-10-CM | POA: Insufficient documentation

## 2021-07-23 DIAGNOSIS — T783XXD Angioneurotic edema, subsequent encounter: Secondary | ICD-10-CM | POA: Diagnosis not present

## 2021-07-23 DIAGNOSIS — T783XXA Angioneurotic edema, initial encounter: Secondary | ICD-10-CM | POA: Diagnosis not present

## 2021-07-23 DIAGNOSIS — R220 Localized swelling, mass and lump, head: Secondary | ICD-10-CM | POA: Insufficient documentation

## 2021-07-23 DIAGNOSIS — R011 Cardiac murmur, unspecified: Secondary | ICD-10-CM | POA: Diagnosis not present

## 2021-07-23 LAB — CBC WITH DIFFERENTIAL/PLATELET
Abs Immature Granulocytes: 0.01 10*3/uL (ref 0.00–0.07)
Basophils Absolute: 0.1 10*3/uL (ref 0.0–0.1)
Basophils Relative: 1 %
Eosinophils Absolute: 0.1 10*3/uL (ref 0.0–0.5)
Eosinophils Relative: 1 %
HCT: 42.1 % (ref 36.0–46.0)
Hemoglobin: 13.6 g/dL (ref 12.0–15.0)
Immature Granulocytes: 0 %
Lymphocytes Relative: 34 %
Lymphs Abs: 2.4 10*3/uL (ref 0.7–4.0)
MCH: 30.3 pg (ref 26.0–34.0)
MCHC: 32.3 g/dL (ref 30.0–36.0)
MCV: 93.8 fL (ref 80.0–100.0)
Monocytes Absolute: 0.6 10*3/uL (ref 0.1–1.0)
Monocytes Relative: 8 %
Neutro Abs: 4 10*3/uL (ref 1.7–7.7)
Neutrophils Relative %: 56 %
Platelets: 277 10*3/uL (ref 150–400)
RBC: 4.49 MIL/uL (ref 3.87–5.11)
RDW: 12.7 % (ref 11.5–15.5)
WBC: 7.2 10*3/uL (ref 4.0–10.5)
nRBC: 0 % (ref 0.0–0.2)

## 2021-07-23 LAB — BASIC METABOLIC PANEL
Anion gap: 11 (ref 5–15)
BUN: 14 mg/dL (ref 8–23)
CO2: 22 mmol/L (ref 22–32)
Calcium: 9.5 mg/dL (ref 8.9–10.3)
Chloride: 104 mmol/L (ref 98–111)
Creatinine, Ser: 0.75 mg/dL (ref 0.44–1.00)
GFR, Estimated: >60 mL/min (ref >60)
Glucose, Bld: 110 mg/dL — ABNORMAL HIGH (ref 70–99)
Potassium: 3.6 mmol/L (ref 3.5–5.1)
Sodium: 137 mmol/L (ref 135–145)

## 2021-07-23 MED ORDER — DIPHENHYDRAMINE HCL 50 MG/ML IJ SOLN
12.5000 mg | Freq: Once | INTRAMUSCULAR | Status: AC
  Administered 2021-07-23: 12.5 mg via INTRAVENOUS
  Filled 2021-07-23: qty 1

## 2021-07-23 MED ORDER — METHYLPREDNISOLONE SODIUM SUCC 125 MG IJ SOLR
125.0000 mg | Freq: Once | INTRAMUSCULAR | Status: AC
  Administered 2021-07-23: 125 mg via INTRAVENOUS
  Filled 2021-07-23: qty 2

## 2021-07-23 MED ORDER — FAMOTIDINE 20 MG IN NS 100 ML IVPB
20.0000 mg | Freq: Once | INTRAVENOUS | Status: AC
  Administered 2021-07-23: 20 mg via INTRAVENOUS
  Filled 2021-07-23: qty 100

## 2021-07-23 MED ORDER — TRANEXAMIC ACID-NACL 1000-0.7 MG/100ML-% IV SOLN
1000.0000 mg | Freq: Once | INTRAVENOUS | Status: AC
  Administered 2021-07-23: 1000 mg via INTRAVENOUS
  Filled 2021-07-23: qty 100

## 2021-07-23 NOTE — ED Triage Notes (Signed)
Patient woke up this morning with upper lip swelling , denies SOB /airway intact , no fever or chills .

## 2021-07-23 NOTE — ED Notes (Signed)
Spoke to [facility] with pharmacy to double check that there are no special administration actions to be followed while administering TXA. Charleston Ropes RPH confirmed that there were not.

## 2021-07-23 NOTE — ED Notes (Signed)
Pt in bed, pt states that she feels like the swelling is getting better, pt denies sob or pain, denies feelings like her throat is closing,  resps even and unlabored, lungs clear.

## 2021-07-23 NOTE — ED Notes (Signed)
Pt denies no changes at this time - pt reports no pain at this time - pt denies SOB or difficulty breathing - airway remains intact at this time

## 2021-07-23 NOTE — ED Provider Notes (Signed)
Emergency Medicine Provider Triage Evaluation Note  Heather Summers , a 70 y.o. female  was evaluated in triage.  Patient with complaints of upper lip swelling that she noted when she woke up @ 0300. Went to bed feeling normal/asymptomatic, woke up with mild upper lip swelling which has progressively worsened since onset. No alleviating/aggravating factors. Denies dyspnea, sensation of throat closing, chest pain, vomiting, syncope or rashes.   She takes lisinopril in the evenings and has since around 2017.  Recently took a new herbal supplement the other day and had it yesterday morning, but no sxs at that time. No other new meds/exposure.    Review of Systems  Positive: Lip swelling.  Negative: Dyspnea, dysphagia, vomiting, syncope, chest pain.   Physical Exam  BP (!) 160/102 (BP Location: Right Arm)   Pulse 86   Temp 98.1 F (36.7 C) (Oral)   Resp 14   SpO2 100%  Gen:   Awake, no distress   Resp:  Normal effort  MSK:   Moves extremities without difficulty  Other:  Upper lip angioedema. No intra-oral swelling/angioedema. No uvular edema. Tolerating own secretions without difficulty. No stridor. Airway is patent.   Medical Decision Making  Medically screening exam initiated at 4:22 AM.  Appropriate orders placed.  Heather Summers was informed that the remainder of the evaluation will be completed by another provider, this initial triage assessment does not replace that evaluation, and the importance of remaining in the ED until their evaluation is complete.  Discussed w/ attending Dr. Wyvonnia Dusky who has evaluated patient in triage- plan for benadryl, pepcid, & solumedrol, hold epi currently, will transfer to room in acute care area for close monitoring.    Leafy Kindle 07/23/21 0426    Ezequiel Essex, MD 07/23/21 [phone]

## 2021-07-23 NOTE — ED Provider Notes (Signed)
Signout note  70 year old female presents to ER with concern for upper lip swelling.  Takes lisinopril.  Suspected to be angioedema.  Patient was provided Pepcid, steroids, Benadryl and further observed.  Symptoms seem to be improving.  Plan at time of signout is to further observe patient another hour or so.  If symptoms stable or improving, discharge.  If worsening, admit.  7:00 AM received sign out from R[place]  9:00 AM reassessed patient, symptoms are improving, still has some mild upper lip swelling, no tongue swelling, no swelling in posterior oropharynx. At present ok for dc. Reivewed strict return precautions, stressed importance of stopping lisinopril and rec close recheck with PCP. She demonstrated good understanding.    Lucrezia Starch, MD 07/23/21 [phone]

## 2021-07-23 NOTE — Discharge Instructions (Signed)
Stop taking lisinopril and never take this medication again.  Let your doctor know that you are stopping it.  Do not take any medications that ended in -pril or -artan. Return to the ED with difficulty breathing, chest pain, tongue or lip swelling, difficulty swallowing or other concerns.

## 2021-07-23 NOTE — ED Provider Notes (Signed)
[facility] EMERGENCY DEPARTMENT Provider Note   CSN: [id number] Arrival date & time: 07/23/21  0343     History Chief Complaint  Patient presents with   Angioedema    Heather Summers is a 70 y.o. female.  Patient presents with upper lip swelling that woke her from sleep about 3 AM.  She was well when she went to bed.  Denies any difficulty breathing, chest pain or, tongue swelling or throat swelling.  She does take lisinopril.  She has never had this problem before.  Been on lisinopril for several years.  Denies any difficulty swallowing or difficulty breathing.  She feels the swelling is getting gradually worse.  Did not take any medications at home for it.  No known allergies. She received prednisone as well as Pepcid and benadryl.  The history is provided by the patient.      Past Medical History:  Diagnosis Date   Anxiety    FOOT PAIN, LEFT    HYPERLIPIDEMIA    Hypertension    Left breast mass    Numbness    PERIMENOPAUSAL SYNDROME    SINUSITIS, ACUTE     Patient Active Problem List   Diagnosis Date Noted   Paresthesia 07/15/2016   Chest discomfort 09/04/2013    Class: Chronic   SINUSITIS, ACUTE 09/30/2009   Hyperlipemia 04/24/2009   PERIMENOPAUSAL SYNDROME 04/24/2009   FOOT PAIN, LEFT 04/24/2009    Past Surgical History:  Procedure Laterality Date   BREAST LUMPECTOMY WITH RADIOACTIVE SEED LOCALIZATION Left 06/06/2019   Procedure: LEFT BREAST LUMPECTOMY WITH RADIOACTIVE SEED LOCALIZATION;  Surgeon: Donnie Mesa, MD;  Location: [facility];  Service: General;  Laterality: Left;   COLONOSCOPY       OB History   No obstetric history on file.     Family History  Problem Relation Age of Onset   Aneurysm Mother    Aneurysm Father    Lung cancer Brother    Heart disease Other        uncles 56's and 21's   Stroke Other    Hypertension Other    Neuropathy Brother     Social History   Tobacco Use   Smoking  status: Never   Smokeless tobacco: Never  Substance Use Topics   Alcohol use: Yes    Comment: Social use only.   Drug use: No    Home Medications Prior to Admission medications   Medication Sig Start Date End Date Taking? Authorizing Provider  ALPHA LIPOIC ACID PO Take 600 mg by mouth daily.    [provider]  aspirin EC 81 MG tablet Take 1 tablet (81 mg total) by mouth daily. 09/24/14   Burtis Junes, NP  Biotin 1000 MCG tablet Take 5,000 mcg by mouth daily.     [provider]  CALCIUM PO Take 500 mg by mouth daily.     [provider]  Cholecalciferol (VITAMIN D PO) Take 1,000 Units by mouth daily.     [provider]  HYDROcodone-acetaminophen (NORCO/VICODIN) 5-325 MG tablet Take 1 tablet by mouth every 6 (six) hours as needed for moderate pain. 06/06/19   Donnie Mesa, MD  lisinopril (PRINIVIL,ZESTRIL) 10 MG tablet TAKE 1 TABLET BY MOUTH DAILY. 05/20/15   Belva Crome, MD  MAGNESIUM PO Take 650 mg by mouth daily.     [provider]  Multiple Vitamin (MULTIVITAMIN) capsule Take 1 capsule by mouth daily.    [provider]  Multiple  Vitamins-Minerals (ZINC PO) Take 1 tablet by mouth daily.    [provider]  Omega-3 Fatty Acids (SEA-OMEGA 70 PO) Take 1,000 mg by mouth.     [provider]  Probiotic Product (PROBIOTIC DAILY PO) Take 1 tablet by mouth daily.    [provider]  UNABLE TO FIND 2 tablets daily. Triphala for regularity.    [provider]    Allergies    Patient has no known allergies.  Review of Systems   Review of Systems  Constitutional:  Negative for activity change, appetite change and fever.  HENT:  Positive for facial swelling. Negative for congestion, rhinorrhea, sore throat and trouble swallowing.   Respiratory:  Negative for cough, chest tightness and shortness of breath.   Cardiovascular:  Negative for chest pain.  Gastrointestinal:  Negative for abdominal  pain, nausea and vomiting.  Genitourinary:  Negative for dysuria.  Musculoskeletal:  Negative for arthralgias and myalgias.  Neurological:  Negative for dizziness, weakness and headaches.   all other systems are negative except as noted in the HPI and PMH.   Physical Exam Updated Vital Signs BP (!) 160/102 (BP Location: Right Arm)   Pulse 86   Temp 98.1 F (36.7 C) (Oral)   Resp 14   SpO2 100%   Physical Exam Vitals and nursing note reviewed.  Constitutional:      General: She is not in acute distress.    Appearance: She is well-developed.  HENT:     Head: Normocephalic and atraumatic.     Mouth/Throat:     Pharynx: No oropharyngeal exudate.     Comments: Upper lip swelling.  Tongue not swollen. OP clear. No drooling or trismus Eyes:     Conjunctiva/sclera: Conjunctivae normal.     Pupils: Pupils are equal, round, and reactive to light.  Neck:     Comments: No meningismus. Cardiovascular:     Rate and Rhythm: Normal rate and regular rhythm.     Heart sounds: Normal heart sounds. No murmur heard. Pulmonary:     Effort: Pulmonary effort is normal. No respiratory distress.     Breath sounds: Normal breath sounds.  Abdominal:     Palpations: Abdomen is soft.     Tenderness: There is no abdominal tenderness. There is no guarding or rebound.  Musculoskeletal:        General: No tenderness. Normal range of motion.     Cervical back: Normal range of motion and neck supple.  Skin:    General: Skin is warm.     Capillary Refill: Capillary refill takes less than 2 seconds.  Neurological:     General: No focal deficit present.     Mental Status: She is alert and oriented to person, place, and time. Mental status is at baseline.     Cranial Nerves: No cranial nerve deficit.     Motor: No abnormal muscle tone.     Coordination: Coordination normal.     Comments:  5/5 strength throughout. CN 2-12 intact.Equal grip strength.   Psychiatric:        Behavior: Behavior normal.     ED Results / Procedures / Treatments   Labs (all labs ordered are listed, but only abnormal results are displayed) Labs Reviewed  BASIC METABOLIC PANEL - Abnormal; Notable for the following components:      Result Value   Glucose, Bld 110 (*)    All other components within normal limits  CBC WITH DIFFERENTIAL/PLATELET    EKG None  Radiology  No results found.  Procedures .Critical Care Performed by: Ezequiel Essex, MD Authorized by: Ezequiel Essex, MD   Critical care provider statement:    Critical care time (minutes):  45   Critical care was necessary to treat or prevent imminent or life-threatening deterioration of the following conditions: angioedema.   Critical care was time spent personally by me on the following activities:  Discussions with consultants, evaluation of patient's response to treatment, examination of patient, ordering and performing treatments and interventions, ordering and review of laboratory studies, ordering and review of radiographic studies, pulse oximetry, re-evaluation of patient's condition, obtaining history from patient or surrogate and review of old charts   Medications Ordered in ED Medications  famotidine (PEPCID) IVPB 20 mg in NS 100 mL IVPB (20 mg Intravenous New Bag/Given 07/23/21 0432)  tranexamic acid (CYKLOKAPRON) IVPB 1,000 mg (has no administration in time range)  methylPREDNISolone sodium succinate (SOLU-MEDROL) 125 mg/2 mL injection 125 mg (125 mg Intravenous Given 07/23/21 0430)  diphenhydrAMINE (BENADRYL) injection 12.5 mg (12.5 mg Intravenous Given 07/23/21 0430)    ED Course  I have reviewed the triage vital signs and the nursing notes.  Pertinent labs & imaging results that were available during my care of the patient were reviewed by me and considered in my medical decision making (see chart for details).    MDM Rules/Calculators/A&P                          Angioedema likely ACE-inhibitor induced. No tongue or  posterior pharynx swelling.   Patient given steroids, antihistamines in triage.  TXA added.   Lisinopril added to allergy list.  Patient instructed not to take this medication again.  Patient observed in the ED with no deterioration of her condition.  She feels her lip swelling has not significantly changed but does like to be mildly improved.  No tongue involvement or throat involvement.  We will continue to observe for 4 hours. To be transferred at shift change.  Anticipate discharge home if remains stable and not progressing.  Patient instructed not to take lisinopril again or any other ACE inhibitor or ARB.  And this is added to her allergy list.  Care transferred to Dr. Roslynn Amble at shift change.    Final Clinical Impression(s) / ED Diagnoses Final diagnoses:  Angioedema, initial encounter    Rx / DC Orders ED Discharge Orders     None        Shawntez Dickison, Annie Main, MD 07/23/21 [phone]

## 2021-07-26 ENCOUNTER — Emergency Department (HOSPITAL_COMMUNITY)
Admission: EM | Admit: 2021-07-26 | Discharge: 2021-07-26 | Disposition: A | Discharge status: Home / Self Care | Payer: Medicare Other | Attending: Emergency Medicine | Admitting: Emergency Medicine

## 2021-07-26 ENCOUNTER — Encounter (HOSPITAL_COMMUNITY): Payer: Self-pay

## 2021-07-26 DIAGNOSIS — R220 Localized swelling, mass and lump, head: Secondary | ICD-10-CM | POA: Insufficient documentation

## 2021-07-26 DIAGNOSIS — K130 Diseases of lips: Secondary | ICD-10-CM | POA: Diagnosis not present

## 2021-07-26 DIAGNOSIS — Z5321 Procedure and treatment not carried out due to patient leaving prior to being seen by health care provider: Secondary | ICD-10-CM | POA: Diagnosis not present

## 2021-07-26 MED ORDER — DEXAMETHASONE SODIUM PHOSPHATE 10 MG/ML IJ SOLN
10.0000 mg | Freq: Once | INTRAMUSCULAR | Status: AC
  Administered 2021-07-26: 10 mg via INTRAMUSCULAR
  Filled 2021-07-26: qty 1

## 2021-07-26 NOTE — ED Notes (Signed)
Pt states she is feeling a little better and is going home due to the wait.  Encouraged her to stay and she states she will take Benadryl and follow-up with her PCP tomorrow.

## 2021-07-26 NOTE — ED Provider Notes (Signed)
Emergency Medicine Provider Triage Evaluation Note  Heather Summers , a 70 y.o. female  was evaluated in triage.  Pt complains of facial swelling.  Was evaluated on Thursday for presumed ACE inhibitor angioedema due to swelling of the upper lip.  This gradually subsided.  Patient was completely asymptomatic on Friday.  She woke on Saturday and noted some swelling to her lower lip.  Was taking Benadryl for this.  Feels it is improving.  Subsequently has felt swelling to the left side of her lower face/cheek.  Took Benadryl initially at 1 AM and another dose at 3:30 AM.  Has not had any inability to swallow, drooling, voice muffling.  Has not taken her lisinopril since Wednesday evening; has since been transitioned to Norvasc.  No other new soaps, lotions, detergents, ingestions.  Review of Systems  Positive: Angioedema  Negative: As above  Physical Exam  BP (!) 157/92 (BP Location: Left Arm)   Pulse 66   Temp 98.3 F (36.8 C) (Oral)   Resp 18   SpO2 100%  Gen:   Awake, no distress   Resp:  Normal effort  MSK:   Moves extremities without difficulty  Other:  Mild angioedema to left lower cheek.  Posterior oropharynx clear without edema.  Tolerating secretions without difficulty.  No stridor.  Lungs clear to auscultation bilaterally.  Medical Decision Making  Medically screening exam initiated at 5:10 AM.  Appropriate orders placed.  [name] was informed that the remainder of the evaluation will be completed by another provider, this initial triage assessment does not replace that evaluation, and the importance of remaining in the ED until their evaluation is complete.  Angioedema   Antonietta Breach, PA-C 07/26/21 7510    Merryl Hacker, MD 07/27/21 [phone]

## 2021-07-26 NOTE — ED Triage Notes (Signed)
Pt stopped taking her lisinopril on the 29th when she was here, yesterday had lip swelling again and today, swelling to L side of cheek and some on the R, denies SOB

## 2021-07-29 ENCOUNTER — Telehealth: Payer: Self-pay

## 2021-07-29 DIAGNOSIS — D2271 Melanocytic nevi of right lower limb, including hip: Secondary | ICD-10-CM | POA: Diagnosis not present

## 2021-07-29 DIAGNOSIS — D2272 Melanocytic nevi of left lower limb, including hip: Secondary | ICD-10-CM | POA: Diagnosis not present

## 2021-07-29 DIAGNOSIS — D485 Neoplasm of uncertain behavior of skin: Secondary | ICD-10-CM | POA: Diagnosis not present

## 2021-07-29 DIAGNOSIS — D2261 Melanocytic nevi of right upper limb, including shoulder: Secondary | ICD-10-CM | POA: Diagnosis not present

## 2021-07-29 DIAGNOSIS — L720 Epidermal cyst: Secondary | ICD-10-CM | POA: Diagnosis not present

## 2021-07-29 DIAGNOSIS — D225 Melanocytic nevi of trunk: Secondary | ICD-10-CM | POA: Diagnosis not present

## 2021-07-29 DIAGNOSIS — L821 Other seborrheic keratosis: Secondary | ICD-10-CM | POA: Diagnosis not present

## 2021-07-29 DIAGNOSIS — D2239 Melanocytic nevi of other parts of face: Secondary | ICD-10-CM | POA: Diagnosis not present

## 2021-07-29 DIAGNOSIS — L738 Other specified follicular disorders: Secondary | ICD-10-CM | POA: Diagnosis not present

## 2021-07-29 NOTE — Telephone Encounter (Signed)
NOTES SCANNED TO REFERRAL 

## 2021-08-03 ENCOUNTER — Other Ambulatory Visit (HOSPITAL_COMMUNITY): Payer: Self-pay | Admitting: Family Medicine

## 2021-08-03 DIAGNOSIS — R011 Cardiac murmur, unspecified: Secondary | ICD-10-CM

## 2021-08-20 ENCOUNTER — Ambulatory Visit (HOSPITAL_COMMUNITY): Payer: Medicare Other | Attending: Internal Medicine

## 2021-08-20 ENCOUNTER — Ambulatory Visit
Admission: RE | Admit: 2021-08-20 | Discharge: 2021-08-20 | Disposition: A | Discharge status: Home / Self Care | Payer: No Typology Code available for payment source | Source: Ambulatory Visit | Attending: Family Medicine | Admitting: Family Medicine

## 2021-08-20 ENCOUNTER — Other Ambulatory Visit: Payer: Self-pay

## 2021-08-20 DIAGNOSIS — E785 Hyperlipidemia, unspecified: Secondary | ICD-10-CM

## 2021-08-20 DIAGNOSIS — R011 Cardiac murmur, unspecified: Secondary | ICD-10-CM | POA: Diagnosis not present

## 2021-08-20 LAB — ECHOCARDIOGRAM COMPLETE
Area-P 1/2: 2.93 cm2
S' Lateral: 2.7 cm

## 2021-08-21 DIAGNOSIS — I10 Essential (primary) hypertension: Secondary | ICD-10-CM | POA: Diagnosis not present

## 2021-09-07 NOTE — Progress Notes (Signed)
Cardiology Office Note:    Date:  09/07/2021   ID:  Jeanette Caprice, DOB 1951-08-11, MRN [id number]  PCP:  [name], MD   [facility] Providers Cardiologist:  None     Referring MD: [name], MD   No chief complaint on file. Murmur  History of Present Illness:    Megen Madewell is a 70 y.o. female with a hx of HLD, HTN, breast lumpectomy with radiation seed for non cancerous lesion (no chemo/radiation) referral for heart murmur  Mrs Uber comes in today for cardiac murmur. In the past, she saw Dr. Tamala Julian before. She was having a chest pressure. She had a nuclear stress and it was normal. No hx of heart catheterization. She has had no hospitalization except for her  2 daughters and then recently for angioedema due to lisinopril 07/23/2021. She is off lisinopril now.   Otherwise, she notes getting a little dizzy at her desk in the past,  her lisinopril was decreased at that time and that helped. She denies syncope. She has no chest pain, sob. Every once in a while she gets an uncomfortable sensation. Similar to prior to her stress test.    Family hx includes uncle died in his 25s of MI. No family hx of congenital heart disease. Stroke family hx. Parents have brain aneurysms. No smoking cigarettes. She stays at a desk. She walks a lot. She has a good diet.  Her blood pressures are well controlled at home < 130/80 mmHg mostly. She's taking amlodipine 2.5 mg daily as her only antihypertensive.    CAC 08/20/2021 CORONARY CALCIUM SCORES:   Left Main: 0   LAD: 0   LCx: 0   RCA: 0   Total Agatston Score: 0 No strong recommendation for statin therapy. Low risk for CVD  Past Medical History:  Diagnosis Date   Anxiety    FOOT PAIN, LEFT    HYPERLIPIDEMIA    Hypertension    Left breast mass    Numbness    PERIMENOPAUSAL SYNDROME    SINUSITIS, ACUTE     Past Surgical History:  Procedure Laterality Date   BREAST LUMPECTOMY WITH RADIOACTIVE SEED  LOCALIZATION Left 06/06/2019   Procedure: LEFT BREAST LUMPECTOMY WITH RADIOACTIVE SEED LOCALIZATION;  Surgeon: Donnie Mesa, MD;  Location: [facility];  Service: General;  Laterality: Left;   COLONOSCOPY      Current Medications: No outpatient medications have been marked as taking for the 09/08/21 encounter (Appointment) with Janina Mayo, MD.     Allergies:   Zestril [lisinopril]   Social History   Socioeconomic History   Marital status: Divorced    Spouse name: Not on file   Number of children: 2   Years of education: Some College   Highest education level: Not on file  Occupational History   Occupation: Insurance  Tobacco Use   Smoking status: Never   Smokeless tobacco: Never  Substance and Sexual Activity   Alcohol use: Yes    Comment: Social use only.   Drug use: No   Sexual activity: Not Currently    Birth control/protection: Post-menopausal  Other Topics Concern   Not on file  Social History Narrative   Lives at home alone.   Right-haned.   4-5 cups coffee per day.   Social Determinants of Health   Financial Resource Strain: Not on file  Food Insecurity: Not on file  Transportation Needs: Not on file  Physical Activity: Not on file  Stress: Not  on file  Social Connections: Not on file     Family History: The patient's family history includes Aneurysm in her father and mother; Heart disease in an other family member; Hypertension in an other family member; Lung cancer in her brother; Neuropathy in her brother; Stroke in an other family member.  ROS:   Please see the history of present illness.     All other systems reviewed and are negative.  EKGs/Labs/Other Studies Reviewed:    The following studies were reviewed today:   EKG:  EKG is  ordered today.  The ekg ordered today demonstrates  Sinus bradycardia HR 55 bpm, sinus arrhythmia  Recent Labs: 07/23/2021: BUN 14; Creatinine, Ser 0.75; Hemoglobin 13.6; Platelets 277; Potassium  3.6; Sodium 137  Recent Lipid Panel    Component Value Date/Time   CHOL 184 09/07/2013 0829   TRIG 52 09/07/2013 0829   HDL 52 09/07/2013 0829   LDLCALC 122 (H) 09/07/2013 0829     Risk Assessment/Calculations:           Physical Exam:    VS:    Vitals:   09/08/21 1553  BP: (!) 150/80  Pulse: (!) 55  SpO2: 99%     Wt Readings from Last 3 Encounters:  06/06/19 142 lb 3.2 oz (64.5 kg)  09/20/16 140 lb (63.5 kg)  07/15/16 139 lb (63 kg)     GEN:  Well nourished, well developed in no acute distress HEENT: Normal NECK: No JVD; No carotid bruits LYMPHATICS: No lymphadenopathy CARDIAC: RRR, 3/6 SEM, early peaking, no diastolic murmurs, rubs, gallops RESPIRATORY:  Clear to auscultation without rales, wheezing or rhonchi  ABDOMEN: Soft, non-tender, non-distended MUSCULOSKELETAL:  No edema; No deformity  SKIN: Warm and dry NEUROLOGIC:  Alert and oriented x 3 PSYCHIATRIC:  Normal affect   ASSESSMENT:    #Murmur: She has a systolic ejection murmur. Ddx flow vs. AS. Considering her CAC of 0, unlikely to have significant valve calcification. She has no diastolic murmurs. She is asymptomatic. She has no hx of structural heart or valve disease. Blood pressure is well controlled at home.  We discussed that if she develops LH, dizziness, syncope , CP or SOB to return. Otherwise will leave as PRN visits.   PLAN:    In order of problems listed above:  Follow up PRN         Medication Adjustments/Labs and Tests Ordered: Current medicines are reviewed at length with the patient today.  Concerns regarding medicines are outlined above.    Signed, Janina Mayo, MD  09/07/2021 2:28 PM    [facility]

## 2021-09-08 ENCOUNTER — Ambulatory Visit (INDEPENDENT_AMBULATORY_CARE_PROVIDER_SITE_OTHER): Payer: Medicare Other | Admitting: Internal Medicine

## 2021-09-08 ENCOUNTER — Other Ambulatory Visit: Payer: Self-pay

## 2021-09-08 ENCOUNTER — Encounter: Payer: Self-pay | Admitting: Internal Medicine

## 2021-09-08 VITALS — BP 150/80 | HR 55 | Ht 65.0 in | Wt 137.0 lb

## 2021-09-08 DIAGNOSIS — R011 Cardiac murmur, unspecified: Secondary | ICD-10-CM

## 2021-09-08 NOTE — Patient Instructions (Signed)

## 2021-10-02 DIAGNOSIS — Z23 Encounter for immunization: Secondary | ICD-10-CM | POA: Diagnosis not present

## 2021-12-16 DIAGNOSIS — E785 Hyperlipidemia, unspecified: Secondary | ICD-10-CM | POA: Diagnosis not present

## 2021-12-22 DIAGNOSIS — M8588 Other specified disorders of bone density and structure, other site: Secondary | ICD-10-CM | POA: Diagnosis not present

## 2021-12-22 DIAGNOSIS — E785 Hyperlipidemia, unspecified: Secondary | ICD-10-CM | POA: Diagnosis not present

## 2021-12-22 DIAGNOSIS — I10 Essential (primary) hypertension: Secondary | ICD-10-CM | POA: Diagnosis not present

## 2021-12-23 ENCOUNTER — Other Ambulatory Visit: Payer: Self-pay | Admitting: Family Medicine

## 2021-12-23 DIAGNOSIS — M8588 Other specified disorders of bone density and structure, other site: Secondary | ICD-10-CM

## 2022-01-18 DIAGNOSIS — K5730 Diverticulosis of large intestine without perforation or abscess without bleeding: Secondary | ICD-10-CM | POA: Diagnosis not present

## 2022-01-18 DIAGNOSIS — Z86010 Personal history of colonic polyps: Secondary | ICD-10-CM | POA: Diagnosis not present

## 2022-01-18 DIAGNOSIS — K648 Other hemorrhoids: Secondary | ICD-10-CM | POA: Diagnosis not present

## 2022-04-28 DIAGNOSIS — Z8742 Personal history of other diseases of the female genital tract: Secondary | ICD-10-CM | POA: Diagnosis not present

## 2022-04-28 DIAGNOSIS — Z1231 Encounter for screening mammogram for malignant neoplasm of breast: Secondary | ICD-10-CM | POA: Diagnosis not present

## 2022-04-28 DIAGNOSIS — R87810 Cervical high risk human papillomavirus (HPV) DNA test positive: Secondary | ICD-10-CM | POA: Diagnosis not present

## 2022-05-26 ENCOUNTER — Ambulatory Visit: Payer: Self-pay

## 2022-05-26 NOTE — Patient Outreach (Signed)
  Care Coordination   05/26/2022 Name: Heather Summers MRN: [id number] DOB: [birth year]   Care Coordination Outreach Attempts:  An unsuccessful telephone outreach was attempted today to offer the patient information about available care coordination services as a benefit of their health plan.   Follow Up Plan:  Additional outreach attempts will be made to offer the patient care coordination information and services.   Encounter Outcome:  No Answer  Care Coordination Interventions Activated:  No   Care Coordination Interventions:  No, not indicated    [facility] [phone]

## 2022-06-04 ENCOUNTER — Ambulatory Visit
Admission: RE | Admit: 2022-06-04 | Discharge: 2022-06-04 | Disposition: A | Discharge status: Home / Self Care | Payer: Medicare Other | Source: Ambulatory Visit | Attending: Family Medicine | Admitting: Family Medicine

## 2022-06-04 DIAGNOSIS — Z780 Asymptomatic menopausal state: Secondary | ICD-10-CM | POA: Diagnosis not present

## 2022-06-04 DIAGNOSIS — M8589 Other specified disorders of bone density and structure, multiple sites: Secondary | ICD-10-CM | POA: Diagnosis not present

## 2022-06-04 DIAGNOSIS — M8588 Other specified disorders of bone density and structure, other site: Secondary | ICD-10-CM

## 2022-06-16 ENCOUNTER — Ambulatory Visit: Payer: Self-pay

## 2022-06-16 NOTE — Patient Outreach (Signed)
  Care Coordination   06/16/2022 Name: Heather Summers MRN: [id number] DOB: 1950-11-22   Care Coordination Outreach Attempts:  A second unsuccessful outreach was attempted today to offer the patient with information about available care coordination services as a benefit of their health plan.     Follow Up Plan:  Additional outreach attempts will be made to offer the patient care coordination information and services.   Encounter Outcome:  No Answer  Care Coordination Interventions Activated:  No   Care Coordination Interventions:  No, not indicated    [facility] [phone]

## 2022-06-25 DIAGNOSIS — M8588 Other specified disorders of bone density and structure, other site: Secondary | ICD-10-CM | POA: Diagnosis not present

## 2022-06-25 DIAGNOSIS — I10 Essential (primary) hypertension: Secondary | ICD-10-CM | POA: Diagnosis not present

## 2022-07-02 DIAGNOSIS — M8588 Other specified disorders of bone density and structure, other site: Secondary | ICD-10-CM | POA: Diagnosis not present

## 2022-07-02 DIAGNOSIS — Z8249 Family history of ischemic heart disease and other diseases of the circulatory system: Secondary | ICD-10-CM | POA: Diagnosis not present

## 2022-07-02 DIAGNOSIS — Z6822 Body mass index (BMI) 22.0-22.9, adult: Secondary | ICD-10-CM | POA: Diagnosis not present

## 2022-07-02 DIAGNOSIS — I10 Essential (primary) hypertension: Secondary | ICD-10-CM | POA: Diagnosis not present

## 2022-07-02 DIAGNOSIS — Z23 Encounter for immunization: Secondary | ICD-10-CM | POA: Diagnosis not present

## 2022-07-02 DIAGNOSIS — Z0000 Encounter for general adult medical examination without abnormal findings: Secondary | ICD-10-CM | POA: Diagnosis not present

## 2022-07-02 DIAGNOSIS — Z1331 Encounter for screening for depression: Secondary | ICD-10-CM | POA: Diagnosis not present

## 2022-09-22 DIAGNOSIS — N6459 Other signs and symptoms in breast: Secondary | ICD-10-CM | POA: Diagnosis not present

## 2022-09-27 DIAGNOSIS — L720 Epidermal cyst: Secondary | ICD-10-CM | POA: Diagnosis not present

## 2022-09-27 DIAGNOSIS — L821 Other seborrheic keratosis: Secondary | ICD-10-CM | POA: Diagnosis not present

## 2022-09-27 DIAGNOSIS — D2271 Melanocytic nevi of right lower limb, including hip: Secondary | ICD-10-CM | POA: Diagnosis not present

## 2022-09-27 DIAGNOSIS — D2262 Melanocytic nevi of left upper limb, including shoulder: Secondary | ICD-10-CM | POA: Diagnosis not present

## 2022-09-27 DIAGNOSIS — D2261 Melanocytic nevi of right upper limb, including shoulder: Secondary | ICD-10-CM | POA: Diagnosis not present

## 2022-09-27 DIAGNOSIS — D225 Melanocytic nevi of trunk: Secondary | ICD-10-CM | POA: Diagnosis not present

## 2022-09-27 DIAGNOSIS — D2272 Melanocytic nevi of left lower limb, including hip: Secondary | ICD-10-CM | POA: Diagnosis not present

## 2022-09-27 DIAGNOSIS — L738 Other specified follicular disorders: Secondary | ICD-10-CM | POA: Diagnosis not present

## 2022-11-30 DIAGNOSIS — M6740 Ganglion, unspecified site: Secondary | ICD-10-CM | POA: Diagnosis not present

## 2022-11-30 DIAGNOSIS — B0089 Other herpesviral infection: Secondary | ICD-10-CM | POA: Diagnosis not present

## 2023-03-30 ENCOUNTER — Telehealth: Payer: Self-pay | Admitting: Internal Medicine

## 2023-03-30 DIAGNOSIS — R030 Elevated blood-pressure reading, without diagnosis of hypertension: Secondary | ICD-10-CM | POA: Diagnosis not present

## 2023-03-30 DIAGNOSIS — R000 Tachycardia, unspecified: Secondary | ICD-10-CM | POA: Diagnosis not present

## 2023-03-30 NOTE — Telephone Encounter (Signed)
Spoke to the patient around 0630 this morning she felt her heart beating really fast. She did not take check her pulse during the episode " because she was afraid to ". She checked her pulse while on the phone HR 69, she is currently asymptomatic. Pt stated she had teeth cleaned yesterday without any anesthesia. Advised the pt to check her HR if she experiences another episode. Explained ED precautions, pt voiced understanding.

## 2023-03-30 NOTE — Telephone Encounter (Signed)
  STAT if HR is under 50 or over 120 (normal HR is 60-100 beats per minute)  What is your heart rate? 130/88 HR 69  Do you have a log of your heart rate readings (document readings)?   Do you have any other symptoms?  Pt said, around 6:30 am today she felt her heart pounding, she can feel her pulse is beating really fast. She didn't able to check his pulse during this time but after she calmed down she her BP 130/88 HR 69. She said, she doesn't have any SOB or CP, she only felt her heart is beating fast

## 2023-03-30 NOTE — Telephone Encounter (Signed)
Spoke to the patient, explained Dr. Wyline Mood recommendations:  She can continue to monitor.   Patient called her PCP and has scheduled appointment on 6/6 with Dr. Uvaldo Rising. Pt stated 1650 bp 126/108 , rechecked while on the phone 134/104 HR 67. Pt is prescribed amlodipine pt usually take the medical at night. Advised pt to take the medication,  and rechecked bp 1-2 . Pt stated her PCP office has a walk in clinic from 5-7pm, advised pt to contact PCP. Advised pt Explained ED precautions, pt voiced understanding. Will forward to MD and nurse for advise.

## 2023-03-31 ENCOUNTER — Other Ambulatory Visit (HOSPITAL_COMMUNITY): Payer: Self-pay | Admitting: Family Medicine

## 2023-03-31 DIAGNOSIS — Z8249 Family history of ischemic heart disease and other diseases of the circulatory system: Secondary | ICD-10-CM | POA: Diagnosis not present

## 2023-03-31 DIAGNOSIS — Z6822 Body mass index (BMI) 22.0-22.9, adult: Secondary | ICD-10-CM | POA: Diagnosis not present

## 2023-03-31 DIAGNOSIS — R000 Tachycardia, unspecified: Secondary | ICD-10-CM | POA: Diagnosis not present

## 2023-04-01 NOTE — Telephone Encounter (Signed)
Patient saw PCP regarding issues yesterday please see Encounter

## 2023-04-15 ENCOUNTER — Ambulatory Visit (HOSPITAL_COMMUNITY)
Admission: RE | Admit: 2023-04-15 | Discharge: 2023-04-15 | Disposition: A | Discharge status: Home / Self Care | Payer: Medicare Other | Source: Ambulatory Visit | Attending: Family Medicine | Admitting: Family Medicine

## 2023-04-15 DIAGNOSIS — Z136 Encounter for screening for cardiovascular disorders: Secondary | ICD-10-CM | POA: Diagnosis not present

## 2023-04-15 DIAGNOSIS — Z8249 Family history of ischemic heart disease and other diseases of the circulatory system: Secondary | ICD-10-CM | POA: Insufficient documentation

## 2023-04-15 DIAGNOSIS — I6782 Cerebral ischemia: Secondary | ICD-10-CM | POA: Diagnosis not present

## 2023-04-15 MED ORDER — GADOBUTROL 1 MMOL/ML IV SOLN
6.0000 mL | Freq: Once | INTRAVENOUS | Status: AC | PRN
  Administered 2023-04-15: 6 mL via INTRAVENOUS

## 2023-05-11 DIAGNOSIS — Z1231 Encounter for screening mammogram for malignant neoplasm of breast: Secondary | ICD-10-CM | POA: Diagnosis not present

## 2023-05-11 DIAGNOSIS — Z01419 Encounter for gynecological examination (general) (routine) without abnormal findings: Secondary | ICD-10-CM | POA: Diagnosis not present

## 2023-07-06 DIAGNOSIS — M8588 Other specified disorders of bone density and structure, other site: Secondary | ICD-10-CM | POA: Diagnosis not present

## 2023-07-06 DIAGNOSIS — I10 Essential (primary) hypertension: Secondary | ICD-10-CM | POA: Diagnosis not present

## 2023-07-13 DIAGNOSIS — E785 Hyperlipidemia, unspecified: Secondary | ICD-10-CM | POA: Diagnosis not present

## 2023-07-13 DIAGNOSIS — Z0000 Encounter for general adult medical examination without abnormal findings: Secondary | ICD-10-CM | POA: Diagnosis not present

## 2023-07-13 DIAGNOSIS — I10 Essential (primary) hypertension: Secondary | ICD-10-CM | POA: Diagnosis not present

## 2023-07-13 DIAGNOSIS — Z23 Encounter for immunization: Secondary | ICD-10-CM | POA: Diagnosis not present

## 2023-07-13 DIAGNOSIS — M8588 Other specified disorders of bone density and structure, other site: Secondary | ICD-10-CM | POA: Diagnosis not present

## 2023-07-13 DIAGNOSIS — G629 Polyneuropathy, unspecified: Secondary | ICD-10-CM | POA: Diagnosis not present

## 2023-07-13 DIAGNOSIS — Z8249 Family history of ischemic heart disease and other diseases of the circulatory system: Secondary | ICD-10-CM | POA: Diagnosis not present

## 2023-07-13 DIAGNOSIS — Z1331 Encounter for screening for depression: Secondary | ICD-10-CM | POA: Diagnosis not present

## 2023-07-13 DIAGNOSIS — Z86010 Personal history of colonic polyps: Secondary | ICD-10-CM | POA: Diagnosis not present

## 2023-07-13 DIAGNOSIS — Z6822 Body mass index (BMI) 22.0-22.9, adult: Secondary | ICD-10-CM | POA: Diagnosis not present

## 2023-08-19 DIAGNOSIS — Z23 Encounter for immunization: Secondary | ICD-10-CM | POA: Diagnosis not present

## 2023-09-05 DIAGNOSIS — M6281 Muscle weakness (generalized): Secondary | ICD-10-CM | POA: Diagnosis not present

## 2023-09-05 DIAGNOSIS — N8110 Cystocele, unspecified: Secondary | ICD-10-CM | POA: Diagnosis not present

## 2023-09-05 DIAGNOSIS — K5900 Constipation, unspecified: Secondary | ICD-10-CM | POA: Diagnosis not present

## 2023-09-12 DIAGNOSIS — N8110 Cystocele, unspecified: Secondary | ICD-10-CM | POA: Diagnosis not present

## 2023-09-12 DIAGNOSIS — M6281 Muscle weakness (generalized): Secondary | ICD-10-CM | POA: Diagnosis not present

## 2023-09-12 DIAGNOSIS — K5900 Constipation, unspecified: Secondary | ICD-10-CM | POA: Diagnosis not present

## 2023-09-19 DIAGNOSIS — M6281 Muscle weakness (generalized): Secondary | ICD-10-CM | POA: Diagnosis not present

## 2023-09-19 DIAGNOSIS — K5900 Constipation, unspecified: Secondary | ICD-10-CM | POA: Diagnosis not present

## 2023-10-04 DIAGNOSIS — K5900 Constipation, unspecified: Secondary | ICD-10-CM | POA: Diagnosis not present

## 2023-10-04 DIAGNOSIS — M6281 Muscle weakness (generalized): Secondary | ICD-10-CM | POA: Diagnosis not present

## 2023-10-04 DIAGNOSIS — N8110 Cystocele, unspecified: Secondary | ICD-10-CM | POA: Diagnosis not present

## 2023-10-10 DIAGNOSIS — M6281 Muscle weakness (generalized): Secondary | ICD-10-CM | POA: Diagnosis not present

## 2023-10-10 DIAGNOSIS — N8110 Cystocele, unspecified: Secondary | ICD-10-CM | POA: Diagnosis not present

## 2023-10-10 DIAGNOSIS — K5900 Constipation, unspecified: Secondary | ICD-10-CM | POA: Diagnosis not present

## 2023-10-25 DIAGNOSIS — K5900 Constipation, unspecified: Secondary | ICD-10-CM | POA: Diagnosis not present

## 2023-10-25 DIAGNOSIS — N8110 Cystocele, unspecified: Secondary | ICD-10-CM | POA: Diagnosis not present

## 2023-10-25 DIAGNOSIS — M6281 Muscle weakness (generalized): Secondary | ICD-10-CM | POA: Diagnosis not present

## 2023-11-07 DIAGNOSIS — N8110 Cystocele, unspecified: Secondary | ICD-10-CM | POA: Diagnosis not present

## 2023-11-07 DIAGNOSIS — K5900 Constipation, unspecified: Secondary | ICD-10-CM | POA: Diagnosis not present

## 2023-11-07 DIAGNOSIS — M6281 Muscle weakness (generalized): Secondary | ICD-10-CM | POA: Diagnosis not present

## 2023-11-21 DIAGNOSIS — M6281 Muscle weakness (generalized): Secondary | ICD-10-CM | POA: Diagnosis not present

## 2023-11-21 DIAGNOSIS — K5900 Constipation, unspecified: Secondary | ICD-10-CM | POA: Diagnosis not present

## 2023-11-21 DIAGNOSIS — N8110 Cystocele, unspecified: Secondary | ICD-10-CM | POA: Diagnosis not present

## 2023-12-19 DIAGNOSIS — R252 Cramp and spasm: Secondary | ICD-10-CM | POA: Diagnosis not present

## 2023-12-19 DIAGNOSIS — I83893 Varicose veins of bilateral lower extremities with other complications: Secondary | ICD-10-CM | POA: Diagnosis not present

## 2023-12-19 DIAGNOSIS — R600 Localized edema: Secondary | ICD-10-CM | POA: Diagnosis not present

## 2023-12-19 DIAGNOSIS — L299 Pruritus, unspecified: Secondary | ICD-10-CM | POA: Diagnosis not present

## 2023-12-28 DIAGNOSIS — K130 Diseases of lips: Secondary | ICD-10-CM | POA: Diagnosis not present

## 2023-12-28 DIAGNOSIS — D2272 Melanocytic nevi of left lower limb, including hip: Secondary | ICD-10-CM | POA: Diagnosis not present

## 2023-12-28 DIAGNOSIS — D2222 Melanocytic nevi of left ear and external auricular canal: Secondary | ICD-10-CM | POA: Diagnosis not present

## 2023-12-28 DIAGNOSIS — L821 Other seborrheic keratosis: Secondary | ICD-10-CM | POA: Diagnosis not present

## 2023-12-28 DIAGNOSIS — D225 Melanocytic nevi of trunk: Secondary | ICD-10-CM | POA: Diagnosis not present

## 2023-12-28 DIAGNOSIS — I788 Other diseases of capillaries: Secondary | ICD-10-CM | POA: Diagnosis not present

## 2023-12-28 DIAGNOSIS — D2262 Melanocytic nevi of left upper limb, including shoulder: Secondary | ICD-10-CM | POA: Diagnosis not present

## 2023-12-28 DIAGNOSIS — D2261 Melanocytic nevi of right upper limb, including shoulder: Secondary | ICD-10-CM | POA: Diagnosis not present

## 2023-12-28 DIAGNOSIS — D1801 Hemangioma of skin and subcutaneous tissue: Secondary | ICD-10-CM | POA: Diagnosis not present

## 2024-01-02 DIAGNOSIS — N8110 Cystocele, unspecified: Secondary | ICD-10-CM | POA: Diagnosis not present

## 2024-01-02 DIAGNOSIS — K5900 Constipation, unspecified: Secondary | ICD-10-CM | POA: Diagnosis not present

## 2024-01-02 DIAGNOSIS — M6281 Muscle weakness (generalized): Secondary | ICD-10-CM | POA: Diagnosis not present

## 2024-01-16 DIAGNOSIS — N8110 Cystocele, unspecified: Secondary | ICD-10-CM | POA: Diagnosis not present

## 2024-01-16 DIAGNOSIS — K5900 Constipation, unspecified: Secondary | ICD-10-CM | POA: Diagnosis not present

## 2024-01-16 DIAGNOSIS — I872 Venous insufficiency (chronic) (peripheral): Secondary | ICD-10-CM | POA: Diagnosis not present

## 2024-01-16 DIAGNOSIS — M6281 Muscle weakness (generalized): Secondary | ICD-10-CM | POA: Diagnosis not present

## 2024-01-18 DIAGNOSIS — Z09 Encounter for follow-up examination after completed treatment for conditions other than malignant neoplasm: Secondary | ICD-10-CM | POA: Diagnosis not present

## 2024-01-18 DIAGNOSIS — I872 Venous insufficiency (chronic) (peripheral): Secondary | ICD-10-CM | POA: Diagnosis not present

## 2024-01-24 DIAGNOSIS — I83892 Varicose veins of left lower extremities with other complications: Secondary | ICD-10-CM | POA: Diagnosis not present

## 2024-01-24 DIAGNOSIS — Z09 Encounter for follow-up examination after completed treatment for conditions other than malignant neoplasm: Secondary | ICD-10-CM | POA: Diagnosis not present

## 2024-01-24 DIAGNOSIS — I83891 Varicose veins of right lower extremities with other complications: Secondary | ICD-10-CM | POA: Diagnosis not present

## 2024-01-30 DIAGNOSIS — M6281 Muscle weakness (generalized): Secondary | ICD-10-CM | POA: Diagnosis not present

## 2024-01-30 DIAGNOSIS — N8110 Cystocele, unspecified: Secondary | ICD-10-CM | POA: Diagnosis not present

## 2024-01-30 DIAGNOSIS — I83892 Varicose veins of left lower extremities with other complications: Secondary | ICD-10-CM | POA: Diagnosis not present

## 2024-01-30 DIAGNOSIS — K5900 Constipation, unspecified: Secondary | ICD-10-CM | POA: Diagnosis not present

## 2024-02-10 DIAGNOSIS — I83891 Varicose veins of right lower extremities with other complications: Secondary | ICD-10-CM | POA: Diagnosis not present

## 2024-02-10 DIAGNOSIS — I83811 Varicose veins of right lower extremities with pain: Secondary | ICD-10-CM | POA: Diagnosis not present

## 2024-02-13 DIAGNOSIS — K5900 Constipation, unspecified: Secondary | ICD-10-CM | POA: Diagnosis not present

## 2024-02-13 DIAGNOSIS — M6281 Muscle weakness (generalized): Secondary | ICD-10-CM | POA: Diagnosis not present

## 2024-02-13 DIAGNOSIS — N8110 Cystocele, unspecified: Secondary | ICD-10-CM | POA: Diagnosis not present

## 2024-02-14 DIAGNOSIS — I83892 Varicose veins of left lower extremities with other complications: Secondary | ICD-10-CM | POA: Diagnosis not present

## 2024-02-20 DIAGNOSIS — M7989 Other specified soft tissue disorders: Secondary | ICD-10-CM | POA: Diagnosis not present

## 2024-02-20 DIAGNOSIS — I83811 Varicose veins of right lower extremities with pain: Secondary | ICD-10-CM | POA: Diagnosis not present

## 2024-02-20 DIAGNOSIS — I83891 Varicose veins of right lower extremities with other complications: Secondary | ICD-10-CM | POA: Diagnosis not present

## 2024-02-22 DIAGNOSIS — I83892 Varicose veins of left lower extremities with other complications: Secondary | ICD-10-CM | POA: Diagnosis not present

## 2024-02-27 DIAGNOSIS — M6281 Muscle weakness (generalized): Secondary | ICD-10-CM | POA: Diagnosis not present

## 2024-02-27 DIAGNOSIS — K5900 Constipation, unspecified: Secondary | ICD-10-CM | POA: Diagnosis not present

## 2024-02-27 DIAGNOSIS — N8110 Cystocele, unspecified: Secondary | ICD-10-CM | POA: Diagnosis not present

## 2024-03-26 DIAGNOSIS — R600 Localized edema: Secondary | ICD-10-CM | POA: Diagnosis not present

## 2024-03-26 DIAGNOSIS — I872 Venous insufficiency (chronic) (peripheral): Secondary | ICD-10-CM | POA: Diagnosis not present

## 2024-03-26 DIAGNOSIS — R252 Cramp and spasm: Secondary | ICD-10-CM | POA: Diagnosis not present

## 2024-03-26 DIAGNOSIS — L299 Pruritus, unspecified: Secondary | ICD-10-CM | POA: Diagnosis not present

## 2024-03-26 DIAGNOSIS — I83893 Varicose veins of bilateral lower extremities with other complications: Secondary | ICD-10-CM | POA: Diagnosis not present

## 2024-05-14 DIAGNOSIS — Z8742 Personal history of other diseases of the female genital tract: Secondary | ICD-10-CM | POA: Diagnosis not present

## 2024-05-14 DIAGNOSIS — Z1231 Encounter for screening mammogram for malignant neoplasm of breast: Secondary | ICD-10-CM | POA: Diagnosis not present

## 2024-05-14 DIAGNOSIS — N8110 Cystocele, unspecified: Secondary | ICD-10-CM | POA: Diagnosis not present

## 2024-06-02 DIAGNOSIS — U071 COVID-19: Secondary | ICD-10-CM | POA: Diagnosis not present

## 2024-06-13 DIAGNOSIS — N8110 Cystocele, unspecified: Secondary | ICD-10-CM | POA: Diagnosis not present

## 2024-07-11 DIAGNOSIS — I83891 Varicose veins of right lower extremities with other complications: Secondary | ICD-10-CM | POA: Diagnosis not present

## 2024-07-11 DIAGNOSIS — R252 Cramp and spasm: Secondary | ICD-10-CM | POA: Diagnosis not present

## 2024-07-11 DIAGNOSIS — L299 Pruritus, unspecified: Secondary | ICD-10-CM | POA: Diagnosis not present

## 2024-07-11 DIAGNOSIS — I87393 Chronic venous hypertension (idiopathic) with other complications of bilateral lower extremity: Secondary | ICD-10-CM | POA: Diagnosis not present

## 2024-07-11 DIAGNOSIS — R600 Localized edema: Secondary | ICD-10-CM | POA: Diagnosis not present

## 2024-07-12 DIAGNOSIS — R87810 Cervical high risk human papillomavirus (HPV) DNA test positive: Secondary | ICD-10-CM | POA: Diagnosis not present

## 2024-07-16 DIAGNOSIS — I10 Essential (primary) hypertension: Secondary | ICD-10-CM | POA: Diagnosis not present

## 2024-07-16 DIAGNOSIS — E785 Hyperlipidemia, unspecified: Secondary | ICD-10-CM | POA: Diagnosis not present

## 2024-07-16 DIAGNOSIS — M8588 Other specified disorders of bone density and structure, other site: Secondary | ICD-10-CM | POA: Diagnosis not present

## 2024-07-24 DIAGNOSIS — Z23 Encounter for immunization: Secondary | ICD-10-CM | POA: Diagnosis not present

## 2024-07-24 DIAGNOSIS — E785 Hyperlipidemia, unspecified: Secondary | ICD-10-CM | POA: Diagnosis not present

## 2024-07-24 DIAGNOSIS — I10 Essential (primary) hypertension: Secondary | ICD-10-CM | POA: Diagnosis not present

## 2024-07-24 DIAGNOSIS — Z0000 Encounter for general adult medical examination without abnormal findings: Secondary | ICD-10-CM | POA: Diagnosis not present

## 2024-07-24 DIAGNOSIS — R0789 Other chest pain: Secondary | ICD-10-CM | POA: Diagnosis not present

## 2024-07-24 DIAGNOSIS — M8588 Other specified disorders of bone density and structure, other site: Secondary | ICD-10-CM | POA: Diagnosis not present

## 2024-07-25 ENCOUNTER — Other Ambulatory Visit (HOSPITAL_BASED_OUTPATIENT_CLINIC_OR_DEPARTMENT_OTHER): Payer: Self-pay | Admitting: Family Medicine

## 2024-07-25 DIAGNOSIS — M8588 Other specified disorders of bone density and structure, other site: Secondary | ICD-10-CM

## 2024-07-27 DIAGNOSIS — N8110 Cystocele, unspecified: Secondary | ICD-10-CM | POA: Diagnosis not present

## 2024-10-02 DIAGNOSIS — L0293 Carbuncle, unspecified: Secondary | ICD-10-CM | POA: Diagnosis not present

## 2024-10-02 DIAGNOSIS — L720 Epidermal cyst: Secondary | ICD-10-CM | POA: Diagnosis not present

## 2025-02-13 ENCOUNTER — Ambulatory Visit (HOSPITAL_BASED_OUTPATIENT_CLINIC_OR_DEPARTMENT_OTHER)
